# Patient Record
Sex: Female | Born: 1985 | Race: White | Hispanic: No | Marital: Married | Smoking: Former smoker
Health system: Southern US, Community
[De-identification: ages and names within clinical notes are randomized; demographics above are authoritative.]

## PROBLEM LIST (undated history)

## (undated) ENCOUNTER — Inpatient Hospital Stay (HOSPITAL_COMMUNITY): Payer: Self-pay

## (undated) DIAGNOSIS — F329 Major depressive disorder, single episode, unspecified: Secondary | ICD-10-CM

## (undated) DIAGNOSIS — R040 Epistaxis: Secondary | ICD-10-CM

## (undated) DIAGNOSIS — Z8619 Personal history of other infectious and parasitic diseases: Secondary | ICD-10-CM

## (undated) DIAGNOSIS — R011 Cardiac murmur, unspecified: Secondary | ICD-10-CM

## (undated) DIAGNOSIS — R87619 Unspecified abnormal cytological findings in specimens from cervix uteri: Secondary | ICD-10-CM

## (undated) DIAGNOSIS — T7840XA Allergy, unspecified, initial encounter: Secondary | ICD-10-CM

## (undated) DIAGNOSIS — Z6791 Unspecified blood type, Rh negative: Secondary | ICD-10-CM

## (undated) DIAGNOSIS — B999 Unspecified infectious disease: Secondary | ICD-10-CM

## (undated) DIAGNOSIS — R51 Headache: Secondary | ICD-10-CM

## (undated) DIAGNOSIS — IMO0002 Reserved for concepts with insufficient information to code with codable children: Secondary | ICD-10-CM

## (undated) DIAGNOSIS — R519 Headache, unspecified: Secondary | ICD-10-CM

## (undated) HISTORY — DX: Cardiac murmur, unspecified: R01.1

## (undated) HISTORY — PX: COSMETIC SURGERY: SHX468

## (undated) HISTORY — DX: Major depressive disorder, single episode, unspecified: F32.9

## (undated) HISTORY — DX: Epistaxis: R04.0

## (undated) HISTORY — DX: Allergy, unspecified, initial encounter: T78.40XA

## (undated) HISTORY — DX: Unspecified infectious disease: B99.9

## (undated) HISTORY — DX: Personal history of other infectious and parasitic diseases: Z86.19

## (undated) HISTORY — DX: Headache, unspecified: R51.9

## (undated) HISTORY — DX: Unspecified blood type, rh negative: Z67.91

## (undated) HISTORY — DX: Headache: R51

## (undated) HISTORY — DX: Reserved for concepts with insufficient information to code with codable children: IMO0002

## (undated) HISTORY — DX: Unspecified abnormal cytological findings in specimens from cervix uteri: R87.619

---
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
## 2008-05-01 ENCOUNTER — Emergency Department (HOSPITAL_COMMUNITY): Admission: EM | Admit: 2008-05-01 | Discharge: 2008-05-01 | Payer: Self-pay | Admitting: Emergency Medicine

## 2008-06-21 ENCOUNTER — Emergency Department (HOSPITAL_COMMUNITY): Admission: EM | Admit: 2008-06-21 | Discharge: 2008-06-21 | Payer: Self-pay | Admitting: Emergency Medicine

## 2009-09-23 HISTORY — PX: DILATION AND CURETTAGE OF UTERUS: SHX78

## 2011-06-21 LAB — URINALYSIS, ROUTINE W REFLEX MICROSCOPIC
Nitrite: NEGATIVE
Protein, ur: NEGATIVE
Urobilinogen, UA: 0.2

## 2011-06-21 LAB — POCT I-STAT, CHEM 8
BUN: 10
Calcium, Ion: 1.19
Creatinine, Ser: 0.9
Sodium: 141
TCO2: 27

## 2011-06-21 LAB — URINE MICROSCOPIC-ADD ON

## 2011-06-24 LAB — URINALYSIS, ROUTINE W REFLEX MICROSCOPIC
Glucose, UA: NEGATIVE
Hgb urine dipstick: NEGATIVE
Protein, ur: NEGATIVE

## 2011-06-24 LAB — URINE MICROSCOPIC-ADD ON

## 2012-09-23 NOTE — L&D Delivery Note (Signed)
Delivery Note  Pt began pushing at about 2230, vtx descending well, FHR variables noted, overall reassuring    At 11:25 PM a viable female was delivered via Vaginal, Spontaneous Delivery (Presentation: Left Occiput Anterior).  Shoulders delivered slowly, APGAR: 8, 9; weight 9 lb 6 oz (4252 g).  Infant placed on mom's abdomen with lusty cry, cord doubly clamped and cut by FOB  Placenta status: Intact, Spontaneous.  Cord: 3 vessels with the following complications: None.  Cord pH: n/a  Anesthesia: None  Episiotomy: None Lacerations: 2nd degree Suture Repair: 3.0 vicryl rapide Est. Blood Loss (mL): 300  Mom to postpartum.  Baby to nursery-stable. Infant remains stable, initially skin-skin then skin-skin w FOB while completing repair Pt plans to BF, declines circumcision  Routine PP orders   [name] 04/05/2013, 2:14 AM

## 2012-10-07 ENCOUNTER — Ambulatory Visit: Payer: Medicaid Other | Admitting: Obstetrics and Gynecology

## 2012-10-07 DIAGNOSIS — Z331 Pregnant state, incidental: Secondary | ICD-10-CM

## 2012-10-08 ENCOUNTER — Encounter: Payer: Self-pay | Admitting: Obstetrics and Gynecology

## 2012-10-08 DIAGNOSIS — O09A0 Supervision of pregnancy with history of molar pregnancy, unspecified trimester: Secondary | ICD-10-CM | POA: Insufficient documentation

## 2012-10-08 LAB — PRENATAL PANEL VII
Antibody Screen: NEGATIVE
Basophils Relative: 0 % (ref 0–1)
Eosinophils Relative: 1 % (ref 0–5)
HCT: 33.7 % — ABNORMAL LOW (ref 36.0–46.0)
HIV: NONREACTIVE
Hemoglobin: 12 g/dL (ref 12.0–15.0)
Lymphocytes Relative: 17 % (ref 12–46)
MCHC: 35.6 g/dL (ref 30.0–36.0)
MCV: 85.5 fL (ref 78.0–100.0)
Monocytes Absolute: 0.6 10*3/uL (ref 0.1–1.0)
Monocytes Relative: 6 % (ref 3–12)
Neutro Abs: 7.6 10*3/uL (ref 1.7–7.7)
RDW: 13.2 % (ref 11.5–15.5)
Rh Type: NEGATIVE

## 2012-10-08 LAB — POCT URINALYSIS DIPSTICK
Bilirubin, UA: NEGATIVE
Ketones, UA: NEGATIVE
Protein, UA: NEGATIVE
pH, UA: 6

## 2012-10-08 NOTE — Progress Notes (Signed)
NOB interview completed.  PNV samples given. NOB work up scheduled for Thursday 10/15/12 w/ MK.

## 2012-10-15 ENCOUNTER — Encounter: Payer: Self-pay | Admitting: Obstetrics and Gynecology

## 2012-10-15 ENCOUNTER — Ambulatory Visit: Payer: Medicaid Other | Admitting: Obstetrics and Gynecology

## 2012-10-15 VITALS — BP 92/60 | Ht 67.0 in | Wt 117.0 lb

## 2012-10-15 DIAGNOSIS — Z331 Pregnant state, incidental: Secondary | ICD-10-CM

## 2012-10-15 LAB — POCT URINALYSIS DIPSTICK
Bilirubin, UA: NEGATIVE
Glucose, UA: NEGATIVE
Ketones, UA: NEGATIVE
Leukocytes, UA: NEGATIVE

## 2012-10-15 NOTE — Progress Notes (Signed)
[redacted]w[redacted]d  Pt has no concerns today Need to discuss Genetic testing.  07/17/2012 was last pap. "WNL"

## 2012-10-16 NOTE — Progress Notes (Addendum)
Patient ID: Jane Foster, female   DOB: 02-24-1986, 27 y.o.   MRN: [id number] Jane Foster is a 27 y.o. female presenting for new ob visit. [redacted]w[redacted]d N,V-none Birth control at time of conception-no Taking PNV yes LMP certain [redacted]w[redacted]d  OB History    Grav Para Term Preterm Abortions TAB SAB Ect Mult Living   4 2 2  1  1   2     term pg x 2 uncomplicated SVD Past Medical History  Diagnosis Date  . Bleeding nose   . Abnormal Pap smear 2006, 2012, 2013    Colpo done, rpt paps, Last pap 06/2012 was normal  . Infection     UTI;frequently in the past, none in 3-4 years  . Blood type, Rh negative    Past Surgical History  Procedure Date  . Dilation and curettage of uterus 2011    d/t molar pregnancy  . Cosmetic surgery     birth mark removal in the 4th grade  FOB with optic nerve hyperplasia Family History: family history includes Bipolar disorder in her brother; Breast cancer in her maternal grandmother and mother; and Other in her maternal grandmother. Social History:  reports that she quit smoking about 2 months ago. She has never used smokeless tobacco. She reports that she does not drink alcohol or use illicit drugs.  @ROS @    Blood pressure 92/60, height 5\' 7"  (1.702 m), weight 117 lb (53.071 kg), last menstrual period 06/28/2012, unknown if currently breastfeeding. Physical exam: Calm, no distress, HEENT wnl lungs clear bilaterally, breasts bilaterally no masses, dimpling, or drainage, AP RRR, abd soft, gravid, nt, bowel sounds active, abdomen nontender,  Normal hair distrubition mons pubis,  EGBUS WNL, sterile speculum exam,  vagina pink, moist normal rugae,  cerix LTC, no cervical motion tenderness, No adnexal masses or tenderness Uterus size 15 weeks Scant white discharge white Bilaterally DTR + 1 no clonus No edema to lower extremities Prenatal labs: ABO, Rh: A/NEG/-- (01/15 0928) Antibody: NEG (01/15 0928) Rubella:  Immune RPR: NON REAC (01/15 0928)  HBsAg: NEGATIVE  (01/15 0928)  HIV: NON REACTIVE (01/15 0928)  Assessment/Plan: [redacted]w[redacted]d GC/CHL WET PREP PAP 10/13 done at [facility] will have pt sign for results  ULTRASOUND  Plan f/o 4 weeks for anatomy Genetic testing discussed and declines Collaboration with Dr.String[facility] [place] [place] 10/16/2012, 1:28 PM Lavera Guise, CNM Late entry from 10/15/2012

## 2012-11-04 ENCOUNTER — Other Ambulatory Visit: Payer: Self-pay

## 2012-11-04 DIAGNOSIS — Z3689 Encounter for other specified antenatal screening: Secondary | ICD-10-CM

## 2012-11-06 ENCOUNTER — Ambulatory Visit: Payer: Medicaid Other | Admitting: Obstetrics and Gynecology

## 2012-11-06 ENCOUNTER — Encounter: Payer: Self-pay | Admitting: Obstetrics and Gynecology

## 2012-11-06 ENCOUNTER — Ambulatory Visit: Payer: Medicaid Other

## 2012-11-06 VITALS — BP 100/62 | Wt 124.0 lb

## 2012-11-06 DIAGNOSIS — Z331 Pregnant state, incidental: Secondary | ICD-10-CM

## 2012-11-06 DIAGNOSIS — Z1389 Encounter for screening for other disorder: Secondary | ICD-10-CM

## 2012-11-06 DIAGNOSIS — Z3689 Encounter for other specified antenatal screening: Secondary | ICD-10-CM

## 2012-11-06 NOTE — Progress Notes (Signed)
[redacted]w[redacted]d No complaints today. 

## 2012-11-06 NOTE — Progress Notes (Signed)
[redacted]w[redacted]d S=d cx 3.53 cm SIUP vtx Post placenta with 3VC Normal fluid Left sided subdiaphragmatic calcified lesion .62 cm.  Color flow is present.   Will refer to MFM for evaluation.  Pt still needs to think about if she desires to have genetic testing

## 2012-11-06 NOTE — Progress Notes (Signed)
[redacted]w[redacted]d Anatomy U/S today

## 2012-11-09 ENCOUNTER — Telehealth: Payer: Self-pay

## 2012-11-09 DIAGNOSIS — K651 Peritoneal abscess: Secondary | ICD-10-CM

## 2012-11-09 NOTE — Telephone Encounter (Signed)
Message copied by Rolla Plate on Mon Nov 09, 2012 10:09 AM ------      Message from: Jaymes Graff      Created: Fri Nov 06, 2012  3:50 PM       Please make sure pt has an appt with mfm.  thax ------

## 2012-11-09 NOTE — Telephone Encounter (Signed)
Spoke with pt rgd referral informed appt with mfm 11/16/12 at 9:00 pt voice understanding

## 2012-11-10 LAB — US OB COMP + 14 WK

## 2012-11-12 ENCOUNTER — Other Ambulatory Visit: Payer: Self-pay | Admitting: Obstetrics and Gynecology

## 2012-11-12 DIAGNOSIS — Z3689 Encounter for other specified antenatal screening: Secondary | ICD-10-CM

## 2012-11-16 ENCOUNTER — Encounter (HOSPITAL_COMMUNITY): Payer: Self-pay

## 2012-11-16 ENCOUNTER — Ambulatory Visit (HOSPITAL_COMMUNITY)
Admission: RE | Admit: 2012-11-16 | Discharge: 2012-11-16 | Disposition: A | Discharge status: Home / Self Care | Payer: Medicaid Other | Source: Ambulatory Visit | Attending: Obstetrics and Gynecology | Admitting: Obstetrics and Gynecology

## 2012-11-16 DIAGNOSIS — O289 Unspecified abnormal findings on antenatal screening of mother: Secondary | ICD-10-CM | POA: Insufficient documentation

## 2012-11-16 DIAGNOSIS — Z3689 Encounter for other specified antenatal screening: Secondary | ICD-10-CM | POA: Insufficient documentation

## 2012-11-17 ENCOUNTER — Encounter: Payer: Self-pay | Admitting: Obstetrics and Gynecology

## 2012-12-03 ENCOUNTER — Encounter: Payer: Medicaid Other | Admitting: Obstetrics and Gynecology

## 2012-12-03 ENCOUNTER — Ambulatory Visit: Payer: Medicaid Other | Admitting: Obstetrics and Gynecology

## 2012-12-03 ENCOUNTER — Encounter: Payer: Self-pay | Admitting: Obstetrics and Gynecology

## 2012-12-03 VITALS — BP 90/50 | Wt 133.0 lb

## 2012-12-03 DIAGNOSIS — Z331 Pregnant state, incidental: Secondary | ICD-10-CM

## 2012-12-03 NOTE — Patient Instructions (Signed)
[facility]

## 2012-12-03 NOTE — Progress Notes (Signed)
[redacted]w[redacted]d Pt w/o complaint 1 hr glu and Rho Gam NV

## 2013-01-25 LAB — OB RESULTS CONSOLE GC/CHLAMYDIA: Gonorrhea: NEGATIVE

## 2013-02-25 LAB — OB RESULTS CONSOLE GC/CHLAMYDIA: Chlamydia: NEGATIVE

## 2013-03-05 ENCOUNTER — Ambulatory Visit (INDEPENDENT_AMBULATORY_CARE_PROVIDER_SITE_OTHER): Payer: Self-pay | Admitting: Pediatrics

## 2013-03-05 DIAGNOSIS — Z7681 Expectant parent(s) prebirth pediatrician visit: Secondary | ICD-10-CM

## 2013-03-05 NOTE — Progress Notes (Signed)
Subjective:     Patient ID: Jane Foster, female   DOB: 01/27/1986, 27 y.o.   MRN: [id number] HPIReview of SystemsPhysical Exam Prenatal consult for 8 months pregnant mother, fourth child, female due 04/04/2013 Discussed logistics of practice and procedure once baby is born Answered questions

## 2013-03-17 ENCOUNTER — Encounter (HOSPITAL_COMMUNITY): Payer: Self-pay | Admitting: *Deleted

## 2013-03-17 ENCOUNTER — Inpatient Hospital Stay (HOSPITAL_COMMUNITY)
Admission: AD | Admit: 2013-03-17 | Discharge: 2013-03-17 | Disposition: A | Discharge status: Home / Self Care | Payer: Medicaid Other | Source: Ambulatory Visit | Attending: Obstetrics and Gynecology | Admitting: Obstetrics and Gynecology

## 2013-03-17 DIAGNOSIS — A084 Viral intestinal infection, unspecified: Secondary | ICD-10-CM

## 2013-03-17 DIAGNOSIS — IMO0001 Reserved for inherently not codable concepts without codable children: Secondary | ICD-10-CM | POA: Insufficient documentation

## 2013-03-17 DIAGNOSIS — R109 Unspecified abdominal pain: Secondary | ICD-10-CM | POA: Insufficient documentation

## 2013-03-17 DIAGNOSIS — M7918 Myalgia, other site: Secondary | ICD-10-CM

## 2013-03-17 DIAGNOSIS — A088 Other specified intestinal infections: Secondary | ICD-10-CM | POA: Insufficient documentation

## 2013-03-17 DIAGNOSIS — O212 Late vomiting of pregnancy: Secondary | ICD-10-CM | POA: Insufficient documentation

## 2013-03-17 DIAGNOSIS — O99891 Other specified diseases and conditions complicating pregnancy: Secondary | ICD-10-CM | POA: Insufficient documentation

## 2013-03-17 DIAGNOSIS — R0602 Shortness of breath: Secondary | ICD-10-CM | POA: Insufficient documentation

## 2013-03-17 LAB — URINALYSIS, ROUTINE W REFLEX MICROSCOPIC
Bilirubin Urine: NEGATIVE
Glucose, UA: NEGATIVE mg/dL
Hgb urine dipstick: NEGATIVE
Ketones, ur: 15 mg/dL — AB
Nitrite: NEGATIVE
Protein, ur: NEGATIVE mg/dL
Specific Gravity, Urine: 1.025 (ref 1.005–1.030)
Urobilinogen, UA: 0.2 mg/dL (ref 0.0–1.0)
pH: 6 (ref 5.0–8.0)

## 2013-03-17 LAB — URINE MICROSCOPIC-ADD ON

## 2013-03-17 NOTE — MAU Note (Signed)
Pt reports having constant mid to lower back pain and SOB with some nausea. Reports occasional ctx as well.

## 2013-03-17 NOTE — MAU Provider Note (Signed)
Medical screening performed by CNM   S: HPI: Jane Foster is a 27 y.o. [phone] at [redacted]w[redacted]d by LMP who presents to maternity admissions reporting upper back and bilateral upper flank pain x1 day and n/v/d x2-3 days.  Pt had episode of shortness of breath today but reports she feels like this was related to pain.  She reports good fetal movement, denies LOF, vaginal bleeding, vaginal itching/burning, urinary symptoms, h/a, dizziness, or fever/chills.    Pt reports improvement in vomiting since taking new Zofran prescription earlier this morning.  O: BP 113/70  Pulse 85  Temp(Src) 98.3 F (36.8 C) (Oral)  Resp 20  Ht 5\' 7"  (1.702 m)  Wt 67.677 kg (149 lb 3.2 oz)  BMI 23.36 kg/m2  SpO2 100%  LMP 06/28/2012  Results for orders placed during the hospital encounter of 03/17/13 (from the past 24 hour(s))  URINALYSIS, ROUTINE W REFLEX MICROSCOPIC     Status: Abnormal   Collection Time    03/17/13  3:15 PM      Result Value Range   Color, Urine YELLOW  YELLOW   APPearance CLEAR  CLEAR   Specific Gravity, Urine 1.025  1.005 - 1.030   pH 6.0  5.0 - 8.0   Glucose, UA NEGATIVE  NEGATIVE mg/dL   Hgb urine dipstick NEGATIVE  NEGATIVE   Bilirubin Urine NEGATIVE  NEGATIVE   Ketones, ur 15 (*) NEGATIVE mg/dL   Protein, ur NEGATIVE  NEGATIVE mg/dL   Urobilinogen, UA 0.2  0.0 - 1.0 mg/dL   Nitrite NEGATIVE  NEGATIVE   Leukocytes, UA SMALL (*) NEGATIVE  URINE MICROSCOPIC-ADD ON     Status: Abnormal   Collection Time    03/17/13  3:15 PM      Result Value Range   Squamous Epithelial / LPF MANY (*) RARE   WBC, UA 7-10  <3 WBC/hpf   Bacteria, UA FEW (*) RARE   Urine-Other MUCOUS PRESENT     FHR baseline 135 with moderate variability, accels present, no decels--Category I Contractions occasional and mild to palpation  Physical Examination: General appearance - alert, well appearing, and in no distress, oriented to person, place, and time and normal appearing weight Chest - clear to auscultation,  no wheezes, rales or rhonchi, symmetric air entry Heart - normal rate, regular rhythm, normal S1, S2, no murmurs, rubs, clicks or gallops Abdomen - gravid, nontender Back exam - Negative CVA tenderness Musculoskeletal - full range of motion without pain  A: 1. Musculoskeletal pain   2. Viral gastroenteritis     P: D/C home  Pt has prenatal appointment tomorrow am Continue Zofran as prescribed Return to MAU as needed  Sharen Counter Certified Nurse-Midwife

## 2013-03-17 NOTE — MAU Note (Signed)
Pt C/O sudden Pain in upper back which radiates around to rib cage bilaterally.  Pain started approx. 1300 and is also radiating under the breast.  Pt states its hard to catch her breath with the pain.  Pt states she is feeling some lower abd cramping pain too.  No vaginal bleeding or ROM.  Good fetal movement.

## 2013-03-19 LAB — URINE CULTURE: Colony Count: 70000

## 2013-04-04 ENCOUNTER — Encounter (HOSPITAL_COMMUNITY): Payer: Self-pay | Admitting: Obstetrics and Gynecology

## 2013-04-04 ENCOUNTER — Inpatient Hospital Stay (HOSPITAL_COMMUNITY)
Admission: AD | Admit: 2013-04-04 | Discharge: 2013-04-06 | DRG: 775 | Disposition: A | Discharge status: Home / Self Care | Payer: Medicaid Other | Source: Ambulatory Visit | Attending: Obstetrics and Gynecology | Admitting: Obstetrics and Gynecology

## 2013-04-04 DIAGNOSIS — O4290 Premature rupture of membranes, unspecified as to length of time between rupture and onset of labor, unspecified weeks of gestation: Principal | ICD-10-CM | POA: Diagnosis present

## 2013-04-04 DIAGNOSIS — D649 Anemia, unspecified: Secondary | ICD-10-CM | POA: Diagnosis not present

## 2013-04-04 DIAGNOSIS — O9903 Anemia complicating the puerperium: Secondary | ICD-10-CM | POA: Diagnosis not present

## 2013-04-04 LAB — AMNISURE RUPTURE OF MEMBRANE (ROM) NOT AT ARMC: Amnisure ROM: POSITIVE

## 2013-04-04 LAB — CBC
HCT: 34.8 % — ABNORMAL LOW (ref 36.0–46.0)
Hemoglobin: 11.8 g/dL — ABNORMAL LOW (ref 12.0–15.0)
MCHC: 33.9 g/dL (ref 30.0–36.0)
WBC: 12.2 10*3/uL — ABNORMAL HIGH (ref 4.0–10.5)

## 2013-04-04 MED ORDER — PHENYLEPHRINE 40 MCG/ML (10ML) SYRINGE FOR IV PUSH (FOR BLOOD PRESSURE SUPPORT)
80.0000 ug | PREFILLED_SYRINGE | INTRAVENOUS | Status: DC | PRN
  Filled 2013-04-04: qty 2

## 2013-04-04 MED ORDER — FLEET ENEMA 7-19 GM/118ML RE ENEM: 1.0000 | ENEMA | Freq: Every day | RECTAL | Status: DC | PRN

## 2013-04-04 MED ORDER — LACTATED RINGERS IV SOLN: 500.0000 mL | Freq: Once | INTRAVENOUS | Status: DC

## 2013-04-04 MED ORDER — OXYTOCIN BOLUS FROM INFUSION
500.0000 mL | INTRAVENOUS | Status: DC
  Administered 2013-04-04: 500 mL via INTRAVENOUS

## 2013-04-04 MED ORDER — ACETAMINOPHEN 325 MG PO TABS: 650.0000 mg | ORAL_TABLET | ORAL | Status: DC | PRN

## 2013-04-04 MED ORDER — TERBUTALINE SULFATE 1 MG/ML IJ SOLN: 0.2500 mg | Freq: Once | INTRAMUSCULAR | Status: AC | PRN

## 2013-04-04 MED ORDER — LIDOCAINE HCL (PF) 1 % IJ SOLN
30.0000 mL | INTRAMUSCULAR | Status: DC | PRN
  Administered 2013-04-04: 30 mL via SUBCUTANEOUS
  Filled 2013-04-04 (×3): qty 30

## 2013-04-04 MED ORDER — DIPHENHYDRAMINE HCL 50 MG/ML IJ SOLN: 12.5000 mg | INTRAMUSCULAR | Status: DC | PRN

## 2013-04-04 MED ORDER — CITRIC ACID-SODIUM CITRATE 334-500 MG/5ML PO SOLN: 30.0000 mL | ORAL | Status: DC | PRN

## 2013-04-04 MED ORDER — EPHEDRINE 5 MG/ML INJ
10.0000 mg | INTRAVENOUS | Status: DC | PRN
  Filled 2013-04-04: qty 2

## 2013-04-04 MED ORDER — IBUPROFEN 600 MG PO TABS
600.0000 mg | ORAL_TABLET | Freq: Four times a day (QID) | ORAL | Status: DC | PRN
  Administered 2013-04-04: 600 mg via ORAL
  Filled 2013-04-04: qty 1

## 2013-04-04 MED ORDER — LACTATED RINGERS IV SOLN: 500.0000 mL | INTRAVENOUS | Status: DC | PRN

## 2013-04-04 MED ORDER — OXYTOCIN 40 UNITS IN LACTATED RINGERS INFUSION - SIMPLE MED
1.0000 m[IU]/min | INTRAVENOUS | Status: DC
  Administered 2013-04-04: 1 m[IU]/min via INTRAVENOUS
  Administered 2013-04-04: 2 m[IU]/min via INTRAVENOUS
  Filled 2013-04-04: qty 1000

## 2013-04-04 MED ORDER — LACTATED RINGERS IV SOLN: INTRAVENOUS | Status: DC

## 2013-04-04 MED ORDER — OXYTOCIN 40 UNITS IN LACTATED RINGERS INFUSION - SIMPLE MED: 62.5000 mL/h | INTRAVENOUS | Status: DC

## 2013-04-04 MED ORDER — OXYCODONE-ACETAMINOPHEN 5-325 MG PO TABS
1.0000 | ORAL_TABLET | ORAL | Status: DC | PRN
  Administered 2013-04-05: 1 via ORAL
  Filled 2013-04-04: qty 1

## 2013-04-04 MED ORDER — FENTANYL 2.5 MCG/ML BUPIVACAINE 1/10 % EPIDURAL INFUSION (WH - ANES): 14.0000 mL/h | INTRAMUSCULAR | Status: DC | PRN

## 2013-04-04 MED ORDER — ONDANSETRON HCL 4 MG/2ML IJ SOLN: 4.0000 mg | Freq: Four times a day (QID) | INTRAMUSCULAR | Status: DC | PRN

## 2013-04-04 NOTE — MAU Note (Signed)
Pt noticed leaking around 330 thi morning. A big gush at 530 and trickling  A little bit since then. Reports a few mild contractions. Good fetal movement reported.

## 2013-04-04 NOTE — Progress Notes (Addendum)
Patient ID: Antonio Woodhams, female   DOB: 14-Jun-1986, 27 y.o.   MRN: [id number] Cherrill Scrima is a 27 y.o. [id number] at [redacted]w[redacted]d admitted for SROM  Subjective: Ambulating in hall, states ctx aren't  very strong but coming more often, requested pitocin to be started about an hour ago  Objective: BP 107/67  Pulse 71  Temp(Src) 98.6 F (37 C) (Oral)  Resp 18  Ht 5\' 7"  (1.702 m)  Wt 151 lb 12.8 oz (68.856 kg)  BMI 23.77 kg/m2  LMP 06/28/2012     FHT:  FHR: 140 bpm, variability: moderate,  accelerations:  Present,  decelerations:  Absent UC:   irregular, every 4-6 minutes SVE:   Dilation: 4.5 Effacement (%): 90 Station: +1 Exam by:: Dherr rn  Exam deferred   Assessment / Plan: labor augmentation, due to SROM x13hours   Labor: early labor Preeclampsia:  no s/s Fetal Wellbeing:  Category I Pain Control:  Labor support without medications Anticipated MOD:  NSVD  Continue pitocin augmentation for adequate ctx  Recheck 2-3hrs  Update physician PRN   Malissa Hippo 04/04/2013, 6:30 PM

## 2013-04-04 NOTE — Progress Notes (Signed)
Patient ID: Jane Foster, female   DOB: [birth year], 27 y.o.   MRN: [id number] Jane Foster is a 28 y.o. [id number] at [redacted]w[redacted]d admitted for SROM/labor  Subjective: Doing well, breathing w ctx, requesting VE, declines need for pain meds, has been ambulating  Objective: BP 111/76  Pulse 98  Temp(Src) 97.7 F (36.5 C) (Oral)  Resp 20  Ht 5\' 7"  (1.702 m)  Wt 151 lb 12.8 oz (68.856 kg)  BMI 23.77 kg/m2  LMP 06/28/2012     FHT:  FHR: 130 bpm, variability: moderate,  accelerations:  Present,  decelerations:  Absent UC:   regular, every 2-4 minutes SVE:   Dilation: 7 Effacement (%): 90 Station: +1 Exam by:: S Khori Underberg CNM    Assessment / Plan: augmentation, progressing  Labor: active labor Preeclampsia:  no s/s Fetal Wellbeing:  Category I Pain Control:  Labor support without medications Anticipated MOD:  NSVD  Recheck 2hrs or prn urge to push  Dr Estanislado Pandy updated   Malissa Hippo 04/04/2013, 9:17 PM

## 2013-04-04 NOTE — H&P (Signed)
Jane Foster is a 27 y.o. female at 57wks today, presenting for r/o SROM. Pt states she had leaking since 0530, w a few small gushes. States occ ctx. Denies any VB, GFM. Was 3cm in the office. amnisure pos. PT desires low intervention birth.   HPI: Pt began PNC at [facility] at 15wks.  Anatomy scan at 18wks, small calcification was noted, (subdiaphragmatic), Pt had a MFM consult and was told it was benign.  She rcv'd rhogam in the 3rd trimester 1hr gtt WNL At 33wks, PTL was ruled out w a FFN that was neg At 37wks, tx'd for UTI  Last VE 3/80/0    Maternal Medical History:  Reason for admission: Rupture of membranes.   Contractions: Onset was 3-5 hours ago.   Frequency: rare.   Perceived severity is mild.    Fetal activity: Perceived fetal activity is normal.   Last perceived fetal movement was within the past hour.    Prenatal complications: no prenatal complications Prenatal Complications - Diabetes: none.    OB History   Grav Para Term Preterm Abortions TAB SAB Ect Mult Living   4 2 2  1  1   2      G1 10/08 39wks female, 7#5oz, SVD, epidural, no complications G2 9/12 39wks female, 7#12oz, SVD, epidural, VAVD G3 6/11 molar pregnancy, D&C, no comp G4 - current preg     Past Medical History  Diagnosis Date  . Bleeding nose   . Abnormal Pap smear 2006, 2012, 2013    Colpo done, rpt paps, Last pap 06/2012 was normal  . Infection     UTI;frequently in the past, none in 3-4 years  . Blood type, Rh negative    Past Surgical History  Procedure Laterality Date  . Dilation and curettage of uterus  2011    d/t molar pregnancy  . Cosmetic surgery      birth mark removal in the 4th grade   Family History: family history includes Bipolar disorder in her brother; Breast cancer in her maternal grandmother and mother; and Other in her maternal grandmother. Social History:  reports that she quit smoking about 8 months ago. She has never used smokeless tobacco. She reports that she  does not drink alcohol or use illicit drugs.   Prenatal Transfer Tool  Maternal Diabetes: No Genetic Screening: Declined Maternal Ultrasounds/Referrals: Abnormal:  Findings:   Other:subdiaphragmatic calcification noted at 18wk anatomy scan, MFM consult stated it was benign.  Fetal Ultrasounds or other Referrals:  Referred to Materal Fetal Medicine  Maternal Substance Abuse:  No Significant Maternal Medications:  None Significant Maternal Lab Results:  Lab values include: Group B Strep negative Other Comments:  None  Review of Systems  All other systems reviewed and are negative.    Dilation: 4 Effacement (%): 90 Exam by:: S. Terril Amaro CNM Blood pressure 106/62, pulse 83, temperature 98 F (36.7 C), temperature source Oral, resp. rate 18, height 5\' 7"  (1.702 m), weight 151 lb 12.8 oz (68.856 kg), last menstrual period 06/28/2012. Maternal Exam:  Uterine Assessment: Contraction strength is mild.  Contraction frequency is rare.   Abdomen: Patient reports no abdominal tenderness. Fundal height is aga.   Estimated fetal weight is 7-8#.   Fetal presentation: vertex  Introitus: Normal vulva. Normal vagina.  Ferning test: negative.  Amniotic fluid character: clear. amnisure pos   Pelvis: adequate for delivery.   Cervix: Cervix evaluated by digital exam.     Fetal Exam Fetal Monitor Review: Mode: ultrasound.   Baseline rate:  130.  Variability: moderate (6-25 bpm).   Pattern: accelerations present and no decelerations.    Fetal State Assessment: Category I - tracings are normal.     Physical Exam  Nursing note and vitals reviewed. Constitutional: She is oriented to person, place, and time. She appears well-developed and well-nourished.  HENT:  Head: Normocephalic.  Eyes: Pupils are equal, round, and reactive to light.  Neck: Normal range of motion.  Cardiovascular: Normal rate, regular rhythm and normal heart sounds.   Respiratory: Effort normal and breath sounds normal.   GI: Soft. Bowel sounds are normal.  Genitourinary: Vagina normal.  Cervix stretchy 4/100/0  Fern neg, sm amt clear fluid noted w vag exam amnisure pos   Musculoskeletal: Normal range of motion.  Neurological: She is alert and oriented to person, place, and time. She has normal reflexes.  Skin: Skin is warm and dry.  Psychiatric: She has a normal mood and affect. Her behavior is normal.    Prenatal labs: ABO, Rh: A/NEG/-- (01/15 0928) Antibody: NEG (01/15 0928) Rubella: 1.81 (01/15 0928) RPR: NON REAC (01/15 0928)  HBsAg: NEGATIVE (01/15 0928)  HIV: NON REACTIVE (01/15 0928)  GBS:   neg 6/17  1hr gtt 84, hgb 11.5, RPR NR 4/11   Assessment/Plan: IUP at 40wks SROM, early labor GBS neg FHR reassuring  Admit to b.s per c/w Dr Su Hilt Routine L&D orders Pt desires non-interventive birth Expectant mgmnt at present     Jandiel Magallanes M 04/04/2013, 4:12 PM

## 2013-04-05 ENCOUNTER — Encounter (HOSPITAL_COMMUNITY): Payer: Self-pay | Admitting: Obstetrics

## 2013-04-05 LAB — CBC
Hemoglobin: 9.8 g/dL — ABNORMAL LOW (ref 12.0–15.0)
MCHC: 34.1 g/dL (ref 30.0–36.0)
Platelets: 174 10*3/uL (ref 150–400)
RDW: 12.8 % (ref 11.5–15.5)

## 2013-04-05 MED ORDER — RHO D IMMUNE GLOBULIN 1500 UNIT/2ML IJ SOLN
300.0000 ug | Freq: Once | INTRAMUSCULAR | Status: AC
  Administered 2013-04-05: 300 ug via INTRAMUSCULAR
  Filled 2013-04-05: qty 2

## 2013-04-05 MED ORDER — PRENATAL MULTIVITAMIN CH
1.0000 | ORAL_TABLET | Freq: Every day | ORAL | Status: DC
  Administered 2013-04-05: 1 via ORAL
  Filled 2013-04-05: qty 1

## 2013-04-05 MED ORDER — BENZOCAINE-MENTHOL 20-0.5 % EX AERO
1.0000 "application " | INHALATION_SPRAY | CUTANEOUS | Status: DC | PRN
  Filled 2013-04-05: qty 56

## 2013-04-05 MED ORDER — BISACODYL 10 MG RE SUPP: 10.0000 mg | Freq: Every day | RECTAL | Status: DC | PRN

## 2013-04-05 MED ORDER — MEDROXYPROGESTERONE ACETATE 150 MG/ML IM SUSP: 150.0000 mg | INTRAMUSCULAR | Status: DC | PRN

## 2013-04-05 MED ORDER — ZOLPIDEM TARTRATE 5 MG PO TABS: 5.0000 mg | ORAL_TABLET | Freq: Every evening | ORAL | Status: DC | PRN

## 2013-04-05 MED ORDER — SIMETHICONE 80 MG PO CHEW: 80.0000 mg | CHEWABLE_TABLET | ORAL | Status: DC | PRN

## 2013-04-05 MED ORDER — ONDANSETRON HCL 4 MG/2ML IJ SOLN: 4.0000 mg | INTRAMUSCULAR | Status: DC | PRN

## 2013-04-05 MED ORDER — OXYCODONE-ACETAMINOPHEN 5-325 MG PO TABS
1.0000 | ORAL_TABLET | ORAL | Status: DC | PRN
  Administered 2013-04-05 – 2013-04-06 (×6): 1 via ORAL
  Filled 2013-04-05: qty 1
  Filled 2013-04-05: qty 2
  Filled 2013-04-05 (×4): qty 1

## 2013-04-05 MED ORDER — LANOLIN HYDROUS EX OINT: TOPICAL_OINTMENT | CUTANEOUS | Status: DC | PRN

## 2013-04-05 MED ORDER — POLYSACCHARIDE IRON COMPLEX 150 MG PO CAPS
150.0000 mg | ORAL_CAPSULE | Freq: Two times a day (BID) | ORAL | Status: DC
  Administered 2013-04-05 – 2013-04-06 (×2): 150 mg via ORAL
  Filled 2013-04-05 (×2): qty 1

## 2013-04-05 MED ORDER — FLEET ENEMA 7-19 GM/118ML RE ENEM: 1.0000 | ENEMA | Freq: Every day | RECTAL | Status: DC | PRN

## 2013-04-05 MED ORDER — DIBUCAINE 1 % RE OINT: 1.0000 "application " | TOPICAL_OINTMENT | RECTAL | Status: DC | PRN

## 2013-04-05 MED ORDER — TETANUS-DIPHTH-ACELL PERTUSSIS 5-2.5-18.5 LF-MCG/0.5 IM SUSP
0.5000 mL | Freq: Once | INTRAMUSCULAR | Status: AC
  Administered 2013-04-06: 0.5 mL via INTRAMUSCULAR

## 2013-04-05 MED ORDER — ONDANSETRON HCL 4 MG PO TABS: 4.0000 mg | ORAL_TABLET | ORAL | Status: DC | PRN

## 2013-04-05 MED ORDER — MEASLES, MUMPS & RUBELLA VAC ~~LOC~~ INJ
0.5000 mL | INJECTION | Freq: Once | SUBCUTANEOUS | Status: DC
  Filled 2013-04-05: qty 0.5

## 2013-04-05 MED ORDER — IBUPROFEN 600 MG PO TABS
600.0000 mg | ORAL_TABLET | Freq: Four times a day (QID) | ORAL | Status: DC
  Administered 2013-04-05 – 2013-04-06 (×5): 600 mg via ORAL
  Filled 2013-04-05 (×5): qty 1

## 2013-04-05 MED ORDER — WITCH HAZEL-GLYCERIN EX PADS: 1.0000 "application " | MEDICATED_PAD | CUTANEOUS | Status: DC | PRN

## 2013-04-05 MED ORDER — SENNOSIDES-DOCUSATE SODIUM 8.6-50 MG PO TABS
2.0000 | ORAL_TABLET | Freq: Every day | ORAL | Status: DC
  Administered 2013-04-05 (×2): 2 via ORAL

## 2013-04-05 MED ORDER — DIPHENHYDRAMINE HCL 25 MG PO CAPS: 25.0000 mg | ORAL_CAPSULE | Freq: Four times a day (QID) | ORAL | Status: DC | PRN

## 2013-04-05 NOTE — Progress Notes (Signed)
Post Partum Day 1: S/P SVD with 2nd degree lac  Subjective: Patient up ad lib, denies syncope or dizziness. Feeding:  Breastfeeding Contraceptive plan:   Micronor  Objective: Blood pressure 102/68, pulse 91, temperature 98.5 F (36.9 C), temperature source Oral, resp. rate 20, height 5\' 7"  (1.702 m), weight 151 lb 12.8 oz (68.856 kg), last menstrual period 06/28/2012, SpO2 97.00%, unknown if currently breastfeeding.  Physical Exam:  General: alert, cooperative, fatigued and no distress Lochia: appropriate Uterine Fundus: firm Incision: healing well DVT Evaluation: No evidence of DVT seen on physical exam. Negative Homan's sign.   Recent Labs  04/04/13 1120 04/05/13 0625  HGB 11.8* 9.8*  HCT 34.8* 28.7*    Assessment/Plan: S/P Vaginal delivery day 1 Continue current care Plan for discharge tomorrow   LOS: 1 day   Alazay Leicht 04/05/2013, 11:18 AM

## 2013-04-05 NOTE — Progress Notes (Addendum)
Patient ID: Jane Foster, female   DOB: 08-30-1986, 27 y.o.   MRN: [id number] Jane Foster is a 27 y.o. [id number] at [redacted]w[redacted]d admitted for SROM/early labor  Subjective: C/o urge to push,   Objective: BP 107/65  Pulse 85  Temp(Src) 98.1 F (36.7 C) (Oral)  Resp 20  Ht 5\' 7"  (1.702 m)  Wt 151 lb 12.8 oz (68.856 kg)  BMI 23.77 kg/m2  SpO2 96%  LMP 06/28/2012  Total I/O In: -  Out: 300 [Blood:300]  FHT:  FHR: 130 bpm, variability: moderate,  accelerations:  Present,  decelerations:  Present, variables Variables noted after AROM of forebag, pitocin dc'd, position change, UC:   regular, every 2-3 minutes SVE:   10/100/+1/+2  forebag AROM'd, sm amt clear fluid    Assessment / Plan: augmentation of labor, progressing Pt began involuntarily pushing, and was pushing well, vtx descending, FHR overall reassuring, early variables persisted   Labor: 2nd stage Preeclampsia:  no s/s Fetal Wellbeing:  Category I Pain Control:  Labor support without medications Anticipated MOD:  NSVD  Continue pushing, CTO FHR closely  Update physician PRN   Stefano Trulson M 04/05/2013, 2:10 AM

## 2013-04-06 LAB — RH IG WORKUP (INCLUDES ABO/RH)
ABO/RH(D): A NEG
Fetal Screen: NEGATIVE
Gestational Age(Wks): 40

## 2013-04-06 MED ORDER — OXYCODONE-ACETAMINOPHEN 5-325 MG PO TABS: 1.0000 | ORAL_TABLET | ORAL | Status: DC | PRN

## 2013-04-06 MED ORDER — IBUPROFEN 600 MG PO TABS: 600.0000 mg | ORAL_TABLET | Freq: Four times a day (QID) | ORAL | Status: DC

## 2013-04-06 MED ORDER — NORETHINDRONE 0.35 MG PO TABS: 1.0000 | ORAL_TABLET | Freq: Every day | ORAL | Status: DC

## 2013-04-06 NOTE — Discharge Summary (Signed)
  Vaginal Delivery Discharge Summary  Lawsyn Heiler  DOB:    [birth year] MRN:    [id number] CSN:    [id number]  Date of admission:                  04/04/13  Date of discharge:                   04/06/13  Procedures this admission: SVD with a 2nd degree lac  Date of Delivery: 04/04/13 by Sanda Klein  Newborn Data:  Live born female  Birth Weight: 9 lb 6 oz (4252 g) APGAR: 8, 9  Home with mother. Name: "Samuel Bouche" Circumcision Plan: no circ  History of Present Illness:  Ms. Lakeisha Waldrop is a 27 y.o. female, [phone], who presents at [redacted]w[redacted]d weeks gestation. The patient has been followed at the [facility] of [facility]. She was admitted rupture of membranes. Her pregnancy has been complicated by:   Patient Active Problem List   Diagnosis Date Noted  . NSVD (normal spontaneous vaginal delivery) 04/05/2013  . Second-degree perineal laceration, with delivery 04/05/2013  . Fetal abnormality in pregnancy, antepartum 11/17/2012  . Rh negative state in antepartum period 10/08/2012  . H/O molar pregnancy, antepartum 10/08/2012   Hospital course:  The patient was admitted for PROM.   Her labor was not complicated. She proceeded to have a vaginal delivery of a healthy infant. Her delivery was not complicated. Her postpartum course was not complicated.  She was discharged to home on postpartum day 2 doing well.  Feeding:  breast  Contraception:  oral progesterone-only contraceptive  Discharge hemoglobin:  Hemoglobin  Date Value Range Status  04/05/2013 9.8* 12.0 - 15.0 g/dL Final     DELTA CHECK NOTED     REPEATED TO VERIFY     HCT  Date Value Range Status  04/05/2013 28.7* 36.0 - 46.0 % Final    Discharge Physical Exam:   General: alert, cooperative and no distress Lochia: appropriate Uterine Fundus: firm Incision: healing well DVT Evaluation: No evidence of DVT seen on physical exam. Negative Homan's  sign.  Intrapartum Procedures: spontaneous vaginal delivery Postpartum Procedures: none Complications-Operative and Postpartum: 2nd degree perineal laceration  Discharge Diagnoses: Term Pregnancy-delivered and PROM x21 hours, mild anemia  Discharge Information:  Activity:           pelvic rest Diet:                routine Medications: PNV, Ibuprofen, Percocet and Micronor Condition:      stable Instructions:  refer to practice specific booklet Discharge to: home  Follow-up Information   Follow up with [facility]. Schedule an appointment as soon as possible for a visit in 6 weeks. (Call with any questions or concerns)    Contact information:   [address] [place] [address] [phone]       Haroldine Laws 04/06/2013

## 2013-04-07 ENCOUNTER — Encounter (HOSPITAL_COMMUNITY): Payer: Self-pay | Admitting: *Deleted

## 2013-04-07 ENCOUNTER — Inpatient Hospital Stay (HOSPITAL_COMMUNITY)
Admission: AD | Admit: 2013-04-07 | Discharge: 2013-04-07 | Disposition: A | Discharge status: Home / Self Care | Payer: Medicaid Other | Source: Ambulatory Visit | Attending: Obstetrics and Gynecology | Admitting: Obstetrics and Gynecology

## 2013-04-07 DIAGNOSIS — IMO0002 Reserved for concepts with insufficient information to code with codable children: Secondary | ICD-10-CM | POA: Insufficient documentation

## 2013-04-07 DIAGNOSIS — R51 Headache: Secondary | ICD-10-CM | POA: Insufficient documentation

## 2013-04-07 DIAGNOSIS — R42 Dizziness and giddiness: Secondary | ICD-10-CM | POA: Insufficient documentation

## 2013-04-07 LAB — CBC
Platelets: 203 10*3/uL (ref 150–400)
RBC: 3.52 MIL/uL — ABNORMAL LOW (ref 3.87–5.11)
WBC: 14.7 10*3/uL — ABNORMAL HIGH (ref 4.0–10.5)

## 2013-04-07 NOTE — MAU Note (Signed)
Patient states she had a vaginal delivery on 7-13. Started having increasing swelling in legs and feet last night. Has a headache a dizziness today.

## 2013-04-07 NOTE — MAU Provider Note (Signed)
History   27yo [id number] - PP day 2, presents after calling the office with c/o HA, dizziness, and swelling in hands and feet.  Pt's may concerns is the swelling and states that the dizziness is her norm and she feels her HA is d/t fatigue.  Pt also states that she doesn't feel that her symptoms really warrented an "ER" visit but was told by the office to come to [facility].  Hgb after delivery was 9.8.  Otherwise pt reports lochia is minimal, breastfeeding is going well, and "feeling fine."  Denies recent fever, resp or GI c/o's, UTI or other PIH s/s.  Chief Complaint  Patient presents with  . Leg Swelling  . Headache  . Dizziness    OB History   Grav Para Term Preterm Abortions TAB SAB Ect Mult Living   4 3 3  1  1   3       Past Medical History  Diagnosis Date  . Bleeding nose   . Abnormal Pap smear 2006, 2012, 2013    Colpo done, rpt paps, Last pap 06/2012 was normal  . Infection     UTI;frequently in the past, none in 3-4 years  . Blood type, Rh negative     Past Surgical History  Procedure Laterality Date  . Dilation and curettage of uterus  2011    d/t molar pregnancy  . Cosmetic surgery      birth mark removal in the 4th grade    Family History  Problem Relation Age of Onset  . Other Maternal Grandmother     Varicose veins  . Breast cancer Mother     Menopausal  . Breast cancer Maternal Grandmother     Menopausal  . Bipolar disorder Brother     History  Substance Use Topics  . Smoking status: Former Smoker    Quit date: 07/24/2012  . Smokeless tobacco: Never Used  . Alcohol Use: No     Comment: Prior to pregnancy 1 beer/night    Allergies: No Known Allergies  Prescriptions prior to admission  Medication Sig Dispense Refill  . calcium carbonate (TUMS - DOSED IN MG ELEMENTAL CALCIUM) 500 MG chewable tablet Chew 2 tablets by mouth daily as needed for heartburn.      Marland Kitchen ibuprofen (ADVIL,MOTRIN) 600 MG tablet Take 1 tablet (600 mg total) by mouth every 6 (six)  hours.  30 tablet  0  . norethindrone (ORTHO MICRONOR) 0.35 MG tablet Take 1 tablet (0.35 mg total) by mouth daily.  1 Package  11  . oxyCODONE-acetaminophen (PERCOCET/ROXICET) 5-325 MG per tablet Take 1-2 tablets by mouth every 4 (four) hours as needed.  30 tablet  0  . Prenatal Vit-Fe Fumarate-FA (PRENATAL MULTIVITAMIN) TABS Take 1 tablet by mouth daily at 12 noon.        ROS: see HPI above, all other systems are negative   Physical Exam   Blood pressure 112/61, pulse 81, temperature 98.5 F (36.9 C), temperature source Oral, resp. rate 18, height 5\' 6"  (1.676 m), weight 143 lb 3.2 oz (64.955 kg), SpO2 100.00%, unknown if currently breastfeeding.  Chest: Clear Heart: RRR Abdomen: gravid, NT Extremities: WNL  Filed Vitals:   04/07/13 1235 04/07/13 1328  BP: 119/61 112/61  Pulse: 80 81  Temp: 98.5 F (36.9 C)   TempSrc: Oral   Resp: 16 18  Height: 5\' 6"  (1.676 m)   Weight: 143 lb 3.2 oz (64.955 kg)   SpO2: 100%     ED Course  PP  day 2 Swelling HA and dizziness  CBC  D/c home with precautions Call office if symptoms get worse F/u with already scheduled PP appt on 8/22   Haroldine Laws CNM, MSN 04/07/2013 1:32 PM

## 2013-04-08 NOTE — Progress Notes (Deleted)
Patient ID: Jane Foster, female   DOB: 1985-12-10, 27 y.o.   MRN: [id number] Ren Grasse is a 27 y.o. [phone] at [redacted]w[redacted]d admitted for SROM  Subjective: Has been ambulating/using ball, some ctx strong, but overall not painful, now requesting pitocin augmentation   Objective: BP 99/63  Pulse 61  Temp(Src) 97.6 F (36.4 C) (Oral)  Resp 18  Ht 5\' 7"  (1.702 m)  Wt 151 lb 12.8 oz (68.856 kg)  BMI 23.77 kg/m2  SpO2 99%  LMP 06/28/2012     FHT:  FHR: 140 bpm, variability: moderate,  accelerations:  Present,  decelerations:  Absent UC:   irregular, every 5-8 minutes SVE:  VE per RN 4.5/90/0   Assessment / Plan: SROM without active labor  Labor: will begin pitocin augmentation Preeclampsia:  no s/s Fetal Wellbeing:  Category I Pain Control:  Labor support without medications Anticipated MOD:  NSVD  Recheck 2-3hrs   Update physician PRN   Irlanda Croghan M 04/08/2013, 11:10 PM (late entry)

## 2013-04-08 NOTE — Progress Notes (Deleted)
Patient ID: Jane Foster, female   DOB: 1985/12/28, 27 y.o.   MRN: [id number] Jane Foster is a 27 y.o. [phone] at [redacted]w[redacted]d admitted for SROM  Subjective: Breathing w ctx now, sitting on toilet, continues leaking fluid, requests VE, declines pain meds, coping well    Objective: BP 99/63  Pulse 61  Temp(Src) 97.6 F (36.4 C) (Oral)  Resp 18  Ht 5\' 7"  (1.702 m)  Wt 151 lb 12.8 oz (68.856 kg)  BMI 23.77 kg/m2  SpO2 99%  LMP 06/28/2012     FHT:  FHR: 140 bpm, variability: moderate,  accelerations:  Present,  decelerations:  Absent FHR not tracing well at times due to maternal position  UC:   regular, every 2-4  minutes SVE:   7/100/0+1   Assessment / Plan: Augmentation of labor, progressing well  Labor: Progressing normally Preeclampsia:  no s/s Fetal Wellbeing:  Category I Pain Control:  Labor support without medications Anticipated MOD:  NSVD  Recheck 2-3hrs   Update physician PRN   Malissa Hippo 04/08/2013, 11:14 PM (late entry)

## 2013-07-29 ENCOUNTER — Other Ambulatory Visit: Payer: Self-pay

## 2014-07-25 ENCOUNTER — Encounter (HOSPITAL_COMMUNITY): Payer: Self-pay | Admitting: *Deleted

## 2014-10-27 ENCOUNTER — Emergency Department (HOSPITAL_COMMUNITY)
Admission: EM | Admit: 2014-10-27 | Discharge: 2014-10-27 | Disposition: A | Discharge status: Home / Self Care | Payer: Medicaid Other | Attending: Emergency Medicine | Admitting: Emergency Medicine

## 2014-10-27 ENCOUNTER — Emergency Department (HOSPITAL_COMMUNITY)
Admission: EM | Admit: 2014-10-27 | Discharge: 2014-10-27 | Disposition: A | Discharge status: Home / Self Care | Payer: Medicaid Other | Source: Home / Self Care | Attending: Emergency Medicine | Admitting: Emergency Medicine

## 2014-10-27 ENCOUNTER — Encounter (HOSPITAL_COMMUNITY): Payer: Self-pay | Admitting: Emergency Medicine

## 2014-10-27 ENCOUNTER — Emergency Department (HOSPITAL_COMMUNITY): Payer: Medicaid Other

## 2014-10-27 DIAGNOSIS — Z87891 Personal history of nicotine dependence: Secondary | ICD-10-CM | POA: Diagnosis not present

## 2014-10-27 DIAGNOSIS — R110 Nausea: Secondary | ICD-10-CM | POA: Insufficient documentation

## 2014-10-27 DIAGNOSIS — R0981 Nasal congestion: Secondary | ICD-10-CM | POA: Insufficient documentation

## 2014-10-27 DIAGNOSIS — R05 Cough: Secondary | ICD-10-CM | POA: Diagnosis present

## 2014-10-27 DIAGNOSIS — R0789 Other chest pain: Secondary | ICD-10-CM

## 2014-10-27 DIAGNOSIS — Z79899 Other long term (current) drug therapy: Secondary | ICD-10-CM | POA: Insufficient documentation

## 2014-10-27 DIAGNOSIS — Z8619 Personal history of other infectious and parasitic diseases: Secondary | ICD-10-CM | POA: Insufficient documentation

## 2014-10-27 DIAGNOSIS — R42 Dizziness and giddiness: Secondary | ICD-10-CM | POA: Insufficient documentation

## 2014-10-27 DIAGNOSIS — M549 Dorsalgia, unspecified: Secondary | ICD-10-CM | POA: Diagnosis not present

## 2014-10-27 DIAGNOSIS — M940 Chondrocostal junction syndrome [Tietze]: Secondary | ICD-10-CM | POA: Diagnosis not present

## 2014-10-27 DIAGNOSIS — Z3202 Encounter for pregnancy test, result negative: Secondary | ICD-10-CM | POA: Diagnosis not present

## 2014-10-27 LAB — CBC WITH DIFFERENTIAL/PLATELET
BASOS ABS: 0 10*3/uL (ref 0.0–0.1)
BASOS PCT: 0 % (ref 0–1)
EOS ABS: 0.2 10*3/uL (ref 0.0–0.7)
EOS PCT: 2 % (ref 0–5)
HEMATOCRIT: 38.3 % (ref 36.0–46.0)
Hemoglobin: 13.4 g/dL (ref 12.0–15.0)
LYMPHS ABS: 3 10*3/uL (ref 0.7–4.0)
Lymphocytes Relative: 31 % (ref 12–46)
MCH: 30 pg (ref 26.0–34.0)
MCHC: 35 g/dL (ref 30.0–36.0)
MCV: 85.9 fL (ref 78.0–100.0)
MONO ABS: 0.5 10*3/uL (ref 0.1–1.0)
Monocytes Relative: 5 % (ref 3–12)
NEUTROS PCT: 62 % (ref 43–77)
Neutro Abs: 5.9 10*3/uL (ref 1.7–7.7)
Platelets: 259 10*3/uL (ref 150–400)
RBC: 4.46 MIL/uL (ref 3.87–5.11)
RDW: 12 % (ref 11.5–15.5)
WBC: 9.6 10*3/uL (ref 4.0–10.5)

## 2014-10-27 LAB — POC URINE PREG, ED: Preg Test, Ur: NEGATIVE

## 2014-10-27 LAB — BASIC METABOLIC PANEL
ANION GAP: 9 (ref 5–15)
BUN: 10 mg/dL (ref 6–23)
CO2: 25 mmol/L (ref 19–32)
Calcium: 9.3 mg/dL (ref 8.4–10.5)
Chloride: 105 mmol/L (ref 96–112)
Creatinine, Ser: 0.8 mg/dL (ref 0.50–1.10)
GFR calc Af Amer: >90 mL/min (ref >90)
Glucose, Bld: 80 mg/dL (ref 70–99)
Potassium: 4 mmol/L (ref 3.5–5.1)
Sodium: 139 mmol/L (ref 135–145)

## 2014-10-27 LAB — I-STAT TROPONIN, ED: Troponin i, poc: 0 ng/mL (ref 0.00–0.08)

## 2014-10-27 LAB — D-DIMER, QUANTITATIVE: D-Dimer, Quant: <0.27 ug/mL-FEU (ref 0.00–0.48)

## 2014-10-27 LAB — POCT PREGNANCY, URINE: Preg Test, Ur: NEGATIVE

## 2014-10-27 NOTE — Discharge Instructions (Signed)
The pain you are having is most likely musculoskeletal, pain from the ribs and muscles called costochondritis. There is a smaller likelihood that it is pleurisy, or inflammation of the linings of the lung and chest wall. Either way, the treatment is conservative management with over the counter NSAIDs. You can take ibuprofen or aleve, ibuprofen up to 600-800mg  per dose. You can also try tylenol as well, 500-650mg  per dose. If you take the higher doses, try to take it with meals because it can irritate the lining of your stomach (and taken long term cause ulcers). Follow up with your primary doctor in 1-2 weeks, or sooner if you think the pain is getting worse.  Costochondritis Costochondritis, sometimes called Tietze syndrome, is a swelling and irritation (inflammation) of the tissue (cartilage) that connects your ribs with your breastbone (sternum). It causes pain in the chest and rib area. Costochondritis usually goes away on its own over time. It can take up to 6 weeks or longer to get better, especially if you are unable to limit your activities. CAUSES  Some cases of costochondritis have no known cause. Possible causes include:  Injury (trauma).  Exercise or activity such as lifting.  Severe coughing. SIGNS AND SYMPTOMS  Pain and tenderness in the chest and rib area.  Pain that gets worse when coughing or taking deep breaths.  Pain that gets worse with specific movements. DIAGNOSIS  Your health care provider will do a physical exam and ask about your symptoms. Chest X-rays or other tests may be done to rule out other problems. TREATMENT  Costochondritis usually goes away on its own over time. Your health care provider may prescribe medicine to help relieve pain. HOME CARE INSTRUCTIONS   Avoid exhausting physical activity. Try not to strain your ribs during normal activity. This would include any activities using chest, abdominal, and side muscles, especially if heavy weights are  used.  Apply ice to the affected area for the first 2 days after the pain begins.  Put ice in a plastic bag.  Place a towel between your skin and the bag.  Leave the ice on for 20 minutes, 2-3 times a day.  Only take over-the-counter or prescription medicines as directed by your health care provider. SEEK MEDICAL CARE IF:  You have redness or swelling at the rib joints. These are signs of infection.  Your pain does not go away despite rest or medicine. SEEK IMMEDIATE MEDICAL CARE IF:   Your pain increases or you are very uncomfortable.  You have shortness of breath or difficulty breathing.  You cough up blood.  You have worse chest pains, sweating, or vomiting.  You have a fever or persistent symptoms for more than 2-3 days.  You have a fever and your symptoms suddenly get worse. MAKE SURE YOU:   Understand these instructions.  Will watch your condition.  Will get help right away if you are not doing well or get worse. Document Released: 06/19/2005 Document Revised: 06/30/2013 Document Reviewed: 04/13/2013 [facility] Patient Information 2015 [employer] [place]. This information is not intended to replace advice given to you by your health care provider. Make sure you discuss any questions you have with your health care provider.

## 2014-10-27 NOTE — ED Provider Notes (Signed)
CSN: [id number]     Arrival date & time 10/27/14  1111 History   First MD Initiated Contact with Patient 10/27/14 1342     Chief Complaint  Patient presents with  . Chest Pain  . Cough     (Consider location/radiation/quality/duration/timing/severity/associated sxs/prior Treatment) HPI Comments: Pt complains of 3 day history of substernal chest pain. No significant PMH. Pt unable to say when exactly the pain started, but does deny any trauma, lifting heavy weights, exercise. She did develop cold symptoms and a cough about 4 days ago, but says the pain is unrelated to the cough. States pain is below the sternum and xiphoid, but also radiates underneath both ribs in the front and into her mid-back. Pain is intermittent, will go away at times and get worse for 30-60 minutes at a time. Pain is dull, but has periods of worsened sharp/stabbing pains that last up to 5 minutes when the dull pain comes on. She does not relate this to anything except maybe a little worse with exertion, slightly improved with rest. Maybe hurts a little more with deep breathing when the pain is present, but can't say for sure. She has tried taking over the counter pain medications like ibuprofen which only help minimally. She has some associated shortness of breath. Denies heartburn, leg swelling, history of DVT or clots. She says she does feel slightly weaker in both of her arms later in the day, but this goes away. She did start taking hormonal birth control pills in November, has been switched 3 different times because of side effect issues. History of DVT in grandmother in setting of malignancy.   Patient is a 29 y.o. female presenting with chest pain and cough. The history is provided by the patient. No language interpreter was used.  Chest Pain Pain location:  Substernal area and epigastric Pain quality: dull, radiating, sharp and stabbing   Pain radiates to:  Mid back and epigastrium Pain radiates to the back: yes    Pain severity:  Moderate Onset quality:  Sudden Duration:  3 days Timing:  Intermittent Progression:  Unchanged Chronicity:  New Context: breathing and movement   Relieved by:  Rest (minimally improved with rest) Worsened by:  Exertion Ineffective treatments: NSAIDs. Associated symptoms: back pain, cough, dizziness, nausea and shortness of breath   Associated symptoms: no abdominal pain, no anxiety, no dysphagia, no fever, no heartburn, no lower extremity edema, no numbness, no palpitations, no syncope and not vomiting   Cough:    Cough characteristics:  Non-productive   Severity:  Mild   Duration:  4 days   Timing:  Constant   Progression:  Unchanged   Chronicity:  New Risk factors: birth control   Risk factors: no immobilization, no prior DVT/PE and no surgery   Cough Associated symptoms: chest pain and shortness of breath   Associated symptoms: no chills, no fever, no rash and no wheezing     Past Medical History  Diagnosis Date  . Bleeding nose   . Abnormal Pap smear 2006, 2012, 2013    Colpo done, rpt paps, Last pap 06/2012 was normal  . Infection     UTI;frequently in the past, none in 3-4 years  . Blood type, Rh negative    Past Surgical History  Procedure Laterality Date  . Dilation and curettage of uterus  2011    d/t molar pregnancy  . Cosmetic surgery      birth mark removal in the 4th grade   Family History  Problem Relation Age of Onset  . Other Maternal Grandmother     Varicose veins  . Breast cancer Mother     Menopausal  . Breast cancer Maternal Grandmother     Menopausal  . Bipolar disorder Brother    History  Substance Use Topics  . Smoking status: Former Smoker    Quit date: 07/24/2012  . Smokeless tobacco: Never Used  . Alcohol Use: No     Comment: Prior to pregnancy 1 beer/night   OB History    Gravida Para Term Preterm AB TAB SAB Ectopic Multiple Living   Review of Systems  Constitutional: Negative for fever  and chills.  HENT: Positive for congestion. Negative for trouble swallowing.   Eyes: Negative for visual disturbance.  Respiratory: Positive for cough and shortness of breath. Negative for wheezing.   Cardiovascular: Positive for chest pain. Negative for palpitations, leg swelling and syncope.  Gastrointestinal: Positive for nausea. Negative for heartburn, vomiting, abdominal pain, diarrhea and constipation.  Genitourinary: Positive for vaginal bleeding (last period was 2 weeks of bleeding, started 1/3; abnormal for her) and menstrual problem (has regular period, expected to have started now but has not). Negative for dysuria and flank pain.  Musculoskeletal: Positive for back pain.  Skin: Negative for rash.  Neurological: Positive for dizziness. Negative for numbness.  All other systems reviewed and are negative.     Allergies  Review of patient's allergies indicates no known allergies.  Home Medications   Prior to Admission medications   Medication Sig Start Date End Date Taking? Authorizing Provider  calcium carbonate (TUMS - DOSED IN MG ELEMENTAL CALCIUM) 500 MG chewable tablet Chew 2 tablets by mouth daily as needed for heartburn.    Historical Provider, MD  ibuprofen (ADVIL,MOTRIN) 600 MG tablet Take 1 tablet (600 mg total) by mouth every 6 (six) hours. 04/06/13   Haroldine Laws, CNM  norethindrone (ORTHO MICRONOR) 0.35 MG tablet Take 1 tablet (0.35 mg total) by mouth daily. 04/06/13   Haroldine Laws, CNM  oxyCODONE-acetaminophen (PERCOCET/ROXICET) 5-325 MG per tablet Take 1-2 tablets by mouth every 4 (four) hours as needed. 04/06/13   Haroldine Laws, CNM  Prenatal Vit-Fe Fumarate-FA (PRENATAL MULTIVITAMIN) TABS Take 1 tablet by mouth daily at 12 noon.    Historical Provider, MD   BP 109/67 mmHg  Pulse 76  Temp(Src) 98.7 F (37.1 C) (Oral)  Resp 16  SpO2 100%  LMP 09/25/2014 Physical Exam  Constitutional: She is oriented to person, place, and time. She appears well-developed  and well-nourished.  HENT:  Head: Normocephalic and atraumatic.  Eyes: Conjunctivae and EOM are normal. Pupils are equal, round, and reactive to light.  Neck: Normal range of motion. Neck supple.  Cardiovascular: Normal rate, regular rhythm, normal heart sounds and intact distal pulses.  Exam reveals no gallop and no friction rub.   No murmur heard. Pulmonary/Chest: Effort normal and breath sounds normal. No respiratory distress. She has no wheezes. She has no rales. She exhibits tenderness (sternum, lower costochondral joints).  Abdominal: Soft. Bowel sounds are normal. She exhibits no distension. There is tenderness (epigastric area, along costal margin). There is no rebound and no guarding.  Musculoskeletal: She exhibits no edema or tenderness.  Neurological: She is alert and oriented to person, place, and time. No cranial nerve deficit.  Skin: Skin is warm and dry. No rash (no rashes over back, chest) noted.  Psychiatric: She has a normal mood  and affect. Her behavior is normal. Judgment and thought content normal.  Nursing note and vitals reviewed.   ED Course  Procedures (including critical care time) Labs Review Labs Reviewed  BASIC METABOLIC PANEL  CBC WITH DIFFERENTIAL/PLATELET  Rosezena Sensor, ED  POC URINE PREG, ED   Labs unremarkable  Imaging Review Dg Chest 2 View  10/27/2014   CLINICAL DATA:  Sharp in character chest pain for 2 days  EXAM: CHEST  2 VIEW  COMPARISON:  May 01, 2008  FINDINGS: There is no edema or consolidation. Heart size and pulmonary vascularity are normal. No adenopathy. No pneumothorax. No bone lesions. There is slight upper thoracic levoscoliosis.  IMPRESSION: No edema or consolidation.   Electronically Signed   By: Bretta Bang M.D.   On: 10/27/2014 12:25     EKG Interpretation None     EKG from urgent care reviewed, normal.  MDM   Final diagnoses:  Costochondritis   2:46 PM Vitals stable. No O2 requirement. CXR, EKG, BMP, CBC  unremarkable, negative i-stat troponin. Negative urine preg. Cannot use PERC due to birth control use, Wells score 0. Favor costochondritis as cause for pain. Obtain d-dimer.  4:27 PM D-dimer negative. Pt not having any pain currently, she feels ready to go home. Stable for discharge.   Nani Ravens, MD 10/27/14 1628  Nelia Shi, MD 10/28/14 [phone]

## 2014-10-27 NOTE — ED Provider Notes (Signed)
CSN: [id number]     Arrival date & time 10/27/14  1008 History   First MD Initiated Contact with Patient 10/27/14 1021     Chief Complaint  Patient presents with  . Chest Pain  . Shortness of Breath   (Consider location/radiation/quality/duration/timing/severity/associated sxs/prior Treatment) HPI  She is a 29 year old woman here for evaluation of chest pains.  They started about 3 days ago. She states it is located Network engineerepigastrically and sternally. It will radiate through her ribs and into her back. It will sometimes be triggered by a deep breath, sometimes by coughing, sometimes by eating, but nothing consistently. She has tried Tylenol and ibuprofen at home without improvement. The pain will make her short of breath. She also reports some shortness of breath when she is up moving around. This occurred in the setting of a cold, but she states her cold symptoms are improving. She reports some mild nausea with the pain, but denies any vomiting. She is taking combined OCPs currently, she states she has been on 3 different kinds in the last several months. Her grandmother had blood clots in the setting of malignancy.  Past Medical History  Diagnosis Date  . Bleeding nose   . Abnormal Pap smear 2006, 2012, 2013    Colpo done, rpt paps, Last pap 06/2012 was normal  . Infection     UTI;frequently in the past, none in 3-4 years  . Blood type, Rh negative    Past Surgical History  Procedure Laterality Date  . Dilation and curettage of uterus  2011    d/t molar pregnancy  . Cosmetic surgery      birth mark removal in the 4th grade   Family History  Problem Relation Age of Onset  . Other Maternal Grandmother     Varicose veins  . Breast cancer Mother     Menopausal  . Breast cancer Maternal Grandmother     Menopausal  . Bipolar disorder Brother    History  Substance Use Topics  . Smoking status: Former Smoker    Quit date: 07/24/2012  . Smokeless tobacco: Never Used  . Alcohol Use: No     Comment: Prior to pregnancy 1 beer/night   OB History    Gravida Para Term Preterm AB TAB SAB Ectopic Multiple Living   4 3 3  1  1   3      Review of Systems  Constitutional: Negative for fever, chills, activity change and appetite change.  HENT: Negative for congestion and rhinorrhea.   Respiratory: Positive for cough and shortness of breath.   Cardiovascular: Positive for chest pain. Negative for palpitations and leg swelling.  Gastrointestinal: Positive for nausea. Negative for vomiting.    Allergies  Review of patient's allergies indicates no known allergies.  Home Medications   Prior to Admission medications   Medication Sig Start Date End Date Taking? Authorizing Provider  norethindrone (ORTHO MICRONOR) 0.35 MG tablet Take 1 tablet (0.35 mg total) by mouth daily. 04/06/13  Yes Haroldine LawsJennifer Oxley, CNM  calcium carbonate (TUMS - DOSED IN MG ELEMENTAL CALCIUM) 500 MG chewable tablet Chew 2 tablets by mouth daily as needed for heartburn.    Historical Provider, MD  ibuprofen (ADVIL,MOTRIN) 600 MG tablet Take 1 tablet (600 mg total) by mouth every 6 (six) hours. 04/06/13   Haroldine LawsJennifer Oxley, CNM  oxyCODONE-acetaminophen (PERCOCET/ROXICET) 5-325 MG per tablet Take 1-2 tablets by mouth every 4 (four) hours as needed. 04/06/13   Haroldine LawsJennifer Oxley, CNM  Prenatal Vit-Fe Fumarate-FA (PRENATAL MULTIVITAMIN)  TABS Take 1 tablet by mouth daily at 12 noon.    Historical Provider, MD   BP 108/75 mmHg  Pulse 75  Temp(Src) 98.6 F (37 C) (Oral)  Resp 16  SpO2 100%  LMP 09/25/2014 Physical Exam  Constitutional: She is oriented to person, place, and time. She appears well-developed and well-nourished.  Sits stiffly, and moves cautiously.  HENT:  Head: Normocephalic and atraumatic.  Nose: Nose normal.  Mouth/Throat: Oropharynx is clear and moist. No oropharyngeal exudate.  Eyes: Conjunctivae are normal.  Neck: Neck supple.  Cardiovascular: Normal rate, regular rhythm and normal heart sounds.   No  murmur heard. Pulmonary/Chest: Effort normal and breath sounds normal. No respiratory distress. She has no wheezes. She has no rales. She exhibits tenderness (at xyphoid, but different from her pain).  Abdominal: Soft. Bowel sounds are normal. She exhibits no distension. There is tenderness (epigastric, but different from her pain). There is no rebound and no guarding.  Musculoskeletal: She exhibits no edema or tenderness.  Lymphadenopathy:    She has no cervical adenopathy.  Neurological: She is alert and oriented to person, place, and time.    ED Course  ED EKG  Date/Time: 10/27/2014 10:47 AM Performed by: Charm Rings Authorized by: Charm Rings Interpreted by ED physician Previous ECG: no previous ECG available Rhythm: sinus rhythm Rate: normal BPM: 73 QRS axis: normal Conduction: conduction normal ST Segments: ST segments normal T Waves: T waves normal Clinical impression: normal ECG   (including critical care time) Labs Review Labs Reviewed  POCT PREGNANCY, URINE    Imaging Review No results found.   MDM   1. Other chest pain    Her EKG is normal and her vital signs are stable. She does have some tenderness to palpation of the xiphoid and epigastric area, however she states this is different from the pains she is having. As she is taking exogenous estrogen, I am unable to rule out PE with PERC rule.  She also does have a family history of blood clot in her grandmother. Differential also includes pneumonia, pleurisy, gastritis, pancreatitis. We'll transfer to [facility] ER via shuttle for additional workup and management.    Charm Rings, MD 10/27/14 [phone]

## 2014-10-27 NOTE — ED Notes (Signed)
C/o  Chest pain with sob.   Pain and pressure felt in center of chest at sternum.  Pain is sharp at times radiating through to back.   Pain is worse with deep breathing.   Mild nausea.  Denies vomiting and diaphoresis.  No cardiac hx.  Symptoms present since 2/1.  No relief with otc meds.

## 2014-10-27 NOTE — ED Notes (Signed)
[facility] around rib area worse with inspiration and cough; pt sts intermittent x 3 weeks

## 2014-10-27 NOTE — ED Notes (Signed)
Pt from home for eval of cough x3 days and cp x2 days, pt reports green sputum with cough and also reports cp is intermittent. Reports nausea and dizziness and sob with cp. NAD noted. Denies any swelling to ankles or legs.

## 2015-01-19 ENCOUNTER — Emergency Department (HOSPITAL_COMMUNITY)
Admission: EM | Admit: 2015-01-19 | Discharge: 2015-01-19 | Disposition: A | Discharge status: Home / Self Care | Payer: Medicaid Other | Source: Home / Self Care | Attending: Emergency Medicine | Admitting: Emergency Medicine

## 2015-01-19 ENCOUNTER — Encounter (HOSPITAL_COMMUNITY): Payer: Self-pay | Admitting: Emergency Medicine

## 2015-01-19 DIAGNOSIS — M791 Myalgia, unspecified site: Secondary | ICD-10-CM

## 2015-01-19 DIAGNOSIS — M546 Pain in thoracic spine: Secondary | ICD-10-CM

## 2015-01-19 DIAGNOSIS — M79602 Pain in left arm: Secondary | ICD-10-CM

## 2015-01-19 DIAGNOSIS — M79605 Pain in left leg: Secondary | ICD-10-CM | POA: Diagnosis not present

## 2015-01-19 NOTE — Discharge Instructions (Signed)
Back Pain, Adult Back pain is very common. The pain often gets better over time. The cause of back pain is usually not dangerous. Most people can learn to manage their back pain on their own.  HOME CARE   Stay active. Start with short walks on flat ground if you can. Try to walk farther each day.  Do not sit, drive, or stand in one place for more than 30 minutes. Do not stay in bed.  Do not avoid exercise or work. Activity can help your back heal faster.  Be careful when you bend or lift an object. Bend at your knees, keep the object close to you, and do not twist.  Sleep on a firm mattress. Lie on your side, and bend your knees. If you lie on your back, put a pillow under your knees.  Only take medicines as told by your doctor.  Put ice on the injured area.  Put ice in a plastic bag.  Place a towel between your skin and the bag.  Leave the ice on for 15-20 minutes, 03-04 times a day for the first 2 to 3 days. After that, you can switch between ice and heat packs.  Ask your doctor about back exercises or massage.  Avoid feeling anxious or stressed. Find good ways to deal with stress, such as exercise. GET HELP RIGHT AWAY IF:   Your pain does not go away with rest or medicine.  Your pain does not go away in 1 week.  You have new problems.  You do not feel well.  The pain spreads into your legs.  You cannot control when you poop (bowel movement) or pee (urinate).  Your arms or legs feel weak or lose feeling (numbness).  You feel sick to your stomach (nauseous) or throw up (vomit).  You have belly (abdominal) pain.  You feel like you may pass out (faint). MAKE SURE YOU:   Understand these instructions.  Will watch your condition.  Will get help right away if you are not doing well or get worse. Document Released: 02/26/2008 Document Revised: 12/02/2011 Document Reviewed: 01/11/2014 [facility] Patient Information 2015 [place], [place]. This information is not intended  to replace advice given to you by your health care provider. Make sure you discuss any questions you have with your health care provider.  Muscle Pain Muscle pain (myalgia) may be caused by many things, including:  Overuse or muscle strain, especially if you are not in shape. This is the most common cause of muscle pain.  Injury.  Bruises.  Viruses, such as the flu.  Infectious diseases.  Fibromyalgia, which is a chronic condition that causes muscle tenderness, fatigue, and headache.  Autoimmune diseases, including lupus.  Certain drugs, including ACE inhibitors and statins. Muscle pain may be mild or severe. In most cases, the pain lasts only a short time and goes away without treatment. To diagnose the cause of your muscle pain, your health care provider will take your medical history. This means he or she will ask you when your muscle pain began and what has been happening. If you have not had muscle pain for very long, your health care provider may want to wait before doing much testing. If your muscle pain has lasted a long time, your health care provider may want to run tests right away. If your health care provider thinks your muscle pain may be caused by illness, you may need to have additional tests to rule out certain conditions.  Treatment for  muscle pain depends on the cause. Home care is often enough to relieve muscle pain. Your health care provider may also prescribe anti-inflammatory medicine. HOME CARE INSTRUCTIONS Watch your condition for any changes. The following actions may help to lessen any discomfort you are feeling:  Only take over-the-counter or prescription medicines as directed by your health care provider.  Apply ice to the sore muscle:  Put ice in a plastic bag.  Place a towel between your skin and the bag.  Leave the ice on for 15-20 minutes, 3-4 times a day.  You may alternate applying hot and cold packs to the muscle as directed by your health care  provider.  If overuse is causing your muscle pain, slow down your activities until the pain goes away.  Remember that it is normal to feel some muscle pain after starting a workout program. Muscles that have not been used often will be sore at first.  Do regular, gentle exercises if you are not usually active.  Warm up before exercising to lower your risk of muscle pain.  Do not continue working out if the pain is very bad. Bad pain could mean you have injured a muscle. SEEK MEDICAL CARE IF:  Your muscle pain gets worse, and medicines do not help.  You have muscle pain that lasts longer than 3 days.  You have a rash or fever along with muscle pain.  You have muscle pain after a tick bite.  You have muscle pain while working out, even though you are in good physical condition.  You have redness, soreness, or swelling along with muscle pain.  You have muscle pain after starting a new medicine or changing the dose of a medicine. SEEK IMMEDIATE MEDICAL CARE IF:  You have trouble breathing.  You have trouble swallowing.  You have muscle pain along with a stiff neck, fever, and vomiting.  You have severe muscle weakness or cannot move part of your body. MAKE SURE YOU:   Understand these instructions.  Will watch your condition.  Will get help right away if you are not doing well or get worse. Document Released: 08/01/2006 Document Revised: 09/14/2013 Document Reviewed: 07/06/2013 [facility] Patient Information 2015 [place], [place]. This information is not intended to replace advice given to you by your health care provider. Make sure you discuss any questions you have with your health care provider.  Musculoskeletal Pain Musculoskeletal pain is muscle and boney aches and pains. These pains can occur in any part of the body. Your caregiver may treat you without knowing the cause of the pain. They may treat you if blood or urine tests, X-rays, and other tests were normal.   CAUSES There is often not a definite cause or reason for these pains. These pains may be caused by a type of germ (virus). The discomfort may also come from overuse. Overuse includes working out too hard when your body is not fit. Boney aches also come from weather changes. Bone is sensitive to atmospheric pressure changes. HOME CARE INSTRUCTIONS   Ask when your test results will be ready. Make sure you get your test results.  Only take over-the-counter or prescription medicines for pain, discomfort, or fever as directed by your caregiver. If you were given medications for your condition, do not drive, operate machinery or power tools, or sign legal documents for 24 hours. Do not drink alcohol. Do not take sleeping pills or other medications that may interfere with treatment.  Continue all activities unless the activities cause  more pain. When the pain lessens, slowly resume normal activities. Gradually increase the intensity and duration of the activities or exercise.  During periods of severe pain, bed rest may be helpful. Lay or sit in any position that is comfortable.  Putting ice on the injured area.  Put ice in a bag.  Place a towel between your skin and the bag.  Leave the ice on for 15 to 20 minutes, 3 to 4 times a day.  Follow up with your caregiver for continued problems and no reason can be found for the pain. If the pain becomes worse or does not go away, it may be necessary to repeat tests or do additional testing. Your caregiver may need to look further for a possible cause. SEEK IMMEDIATE MEDICAL CARE IF:  You have pain that is getting worse and is not relieved by medications.  You develop chest pain that is associated with shortness or breath, sweating, feeling sick to your stomach (nauseous), or throw up (vomit).  Your pain becomes localized to the abdomen.  You develop any new symptoms that seem different or that concern you. MAKE SURE YOU:   Understand these  instructions.  Will watch your condition.  Will get help right away if you are not doing well or get worse. Document Released: 09/09/2005 Document Revised: 12/02/2011 Document Reviewed: 05/14/2013 [facility] Patient Information 2015 [place]. This information is not intended to replace advice given to you by your health care provider. Make sure you discuss any questions you have with your health care provider.

## 2015-01-19 NOTE — ED Notes (Signed)
C/o left sided body pain.   Having pain in face, arm, lower back, and neck.   Denies injury.  No relief with ibuprofen.

## 2015-01-19 NOTE — ED Provider Notes (Signed)
CSN: [id number]     Arrival date & time 01/19/15  1243 History   First MD Initiated Contact with Patient 01/19/15 1500     Chief Complaint  Patient presents with  . Numbness  . Chest Pain   (Consider location/radiation/quality/duration/timing/severity/associated sxs/prior Treatment) HPI Comments: 29 year old female who states she is [redacted] weeks pregnant is complaining of migrating pains to the left neck, face, left outer arm and left parathoracic areas. 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 Denies any known trauma.   Past Medical History  Diagnosis Date  . Bleeding nose   . Abnormal Pap smear 2006, 2012, 2013    Colpo done, rpt paps, Last pap 06/2012 was normal  . Infection     UTI;frequently in the past, none in 3-4 years  . Blood type, Rh negative    Past Surgical History  Procedure Laterality Date  . Dilation and curettage of uterus  2011    d/t molar pregnancy  . Cosmetic surgery      birth mark removal in the 4th grade   Family History  Problem Relation Age of Onset  . Other Maternal Grandmother     Varicose veins  . Breast cancer Mother     Menopausal  . Breast cancer Maternal Grandmother     Menopausal  . Bipolar disorder Brother    History  Substance Use Topics  . Smoking status: Former Smoker    Quit date: 07/24/2012  . Smokeless tobacco: Never Used  . Alcohol Use: No     Comment: Prior to pregnancy 1 beer/night   OB History    Gravida Para Term Preterm AB TAB SAB Ectopic Multiple Living   Review of Systems  Constitutional: Negative.   HENT: Negative.   Respiratory: Negative.   Gastrointestinal: Negative.   Genitourinary: Negative for dysuria, urgency, frequency, hematuria, flank pain, vaginal bleeding, menstrual problem and pelvic pain.  Musculoskeletal: Positive for myalgias, back pain and neck pain. Negative for joint  swelling, arthralgias and neck stiffness.  Skin: Negative for color change, pallor and rash.  Neurological: Negative.   Psychiatric/Behavioral: Negative for behavioral problems and agitation.    Allergies  Review of patient's allergies indicates no known allergies.  Home Medications   Prior to Admission medications   Medication Sig Start Date End Date Taking? Authorizing Provider  ibuprofen (ADVIL,MOTRIN) 600 MG tablet Take 1 tablet (600 mg total) by mouth every 6 (six) hours. 04/06/13  Yes Haroldine Laws, CNM  norethindrone (ORTHO MICRONOR) 0.35 MG tablet Take 1 tablet (0.35 mg total) by mouth daily. Patient not taking: Reported on 10/27/2014 04/06/13   Haroldine Laws, CNM  oxyCODONE-acetaminophen (PERCOCET/ROXICET) 5-325 MG per tablet Take 1-2 tablets by mouth every 4 (four) hours as needed. Patient not taking: Reported on 10/27/2014 04/06/13   Haroldine Laws, CNM  TRINESSA, 28, 0.18/0.215/0.25 MG-35 MCG tablet Take 1 tablet by mouth daily. 10/03/14   Historical Provider, MD   BP 114/69 mmHg  Pulse 69  Temp(Src) 98.2 F (36.8 C) (Oral)  Resp 12  SpO2 100%  LMP  Physical Exam  Constitutional: She is oriented to person, place, and time. She appears well-developed and well-nourished. No distress.  Neck: Normal range of motion. Neck supple.  Cardiovascular: Normal rate and normal heart sounds.   No murmur heard. Pulmonary/Chest: Effort normal  and breath sounds normal. No respiratory distress. She has no wheezes. She has no rales.  Abdominal: Soft. There is no tenderness.  Musculoskeletal: She exhibits tenderness. She exhibits no edema.  There is tenderness to the left deltoid in the midportion of the triceps. His pain is reproduced with abduction against resistance. She has full range of motion of the left shoulder and arm. Distal neurovascular motor Sentry is intact. Circulation is normal. Radial pulse 2+. Capillary refill brisk. Normal warmth and color. There is tenderness to the left  upper parathoracic musculature with a palpable body spasm at approximately T3 and 4. Lesser pain to the lower parathoracic and upper lumbar musculature. Patient is thin ribs are palpable and there is some tenderness to the intercostal muscles. Good movement, flexion and extension to the spine without limitation. No cervical tenderness. No deformity to the spine or other areas. No swelling or discoloration.  Neurological: She is alert and oriented to person, place, and time.  Skin: Skin is warm.  Psychiatric: She has a normal mood and affect.  Nursing note and vitals reviewed.   ED Course  Procedures (including critical care time) Labs Review Labs Reviewed - No data to display  Imaging Review No results found.   MDM   1. Musculoskeletal arm pain, left   2. Left-sided thoracic back pain   3. Muscle pain     May use Tylenol for pain. Avoid NSAIDs due to pregnancy Use heat. Limit those activities that may increase pain. Reassurance.      Hayden Rasmussenavid Roshaunda Starkey, NP 01/19/15 1549

## 2015-04-27 ENCOUNTER — Inpatient Hospital Stay (HOSPITAL_COMMUNITY): Payer: Medicaid Other

## 2015-04-27 ENCOUNTER — Inpatient Hospital Stay (HOSPITAL_COMMUNITY)
Admission: AD | Admit: 2015-04-27 | Discharge: 2015-04-27 | Disposition: A | Discharge status: Home / Self Care | Payer: Medicaid Other | Source: Ambulatory Visit | Attending: Obstetrics and Gynecology | Admitting: Obstetrics and Gynecology

## 2015-04-27 ENCOUNTER — Encounter (HOSPITAL_COMMUNITY): Payer: Self-pay

## 2015-04-27 DIAGNOSIS — O209 Hemorrhage in early pregnancy, unspecified: Secondary | ICD-10-CM | POA: Insufficient documentation

## 2015-04-27 DIAGNOSIS — Z3A08 8 weeks gestation of pregnancy: Secondary | ICD-10-CM | POA: Insufficient documentation

## 2015-04-27 DIAGNOSIS — Z6791 Unspecified blood type, Rh negative: Secondary | ICD-10-CM | POA: Diagnosis present

## 2015-04-27 DIAGNOSIS — Z9889 Other specified postprocedural states: Secondary | ICD-10-CM

## 2015-04-27 DIAGNOSIS — Z87891 Personal history of nicotine dependence: Secondary | ICD-10-CM | POA: Insufficient documentation

## 2015-04-27 DIAGNOSIS — R41840 Attention and concentration deficit: Secondary | ICD-10-CM | POA: Diagnosis present

## 2015-04-27 LAB — URINALYSIS, ROUTINE W REFLEX MICROSCOPIC
Bilirubin Urine: NEGATIVE
Glucose, UA: NEGATIVE mg/dL
Hgb urine dipstick: NEGATIVE
Ketones, ur: NEGATIVE mg/dL
Leukocytes, UA: NEGATIVE
Nitrite: NEGATIVE
PROTEIN: NEGATIVE mg/dL
SPECIFIC GRAVITY, URINE: 1.01 (ref 1.005–1.030)
UROBILINOGEN UA: 0.2 mg/dL (ref 0.0–1.0)
pH: 5.5 (ref 5.0–8.0)

## 2015-04-27 LAB — HCG, QUANTITATIVE, PREGNANCY: hCG, Beta Chain, Quant, S: 115016 m[IU]/mL — ABNORMAL HIGH (ref <5)

## 2015-04-27 LAB — CBC
HCT: 36.5 % (ref 36.0–46.0)
Hemoglobin: 12.8 g/dL (ref 12.0–15.0)
MCH: 29.9 pg (ref 26.0–34.0)
MCHC: 35.1 g/dL (ref 30.0–36.0)
MCV: 85.3 fL (ref 78.0–100.0)
Platelets: 252 10*3/uL (ref 150–400)
RBC: 4.28 MIL/uL (ref 3.87–5.11)
RDW: 12.3 % (ref 11.5–15.5)
WBC: 11.5 10*3/uL — ABNORMAL HIGH (ref 4.0–10.5)

## 2015-04-27 LAB — RH IG WORKUP (INCLUDES ABO/RH)
ABO/RH(D): A NEG
GESTATIONAL AGE(WKS): 8

## 2015-04-27 LAB — WET PREP, GENITAL
CLUE CELLS WET PREP: NONE SEEN
Trich, Wet Prep: NONE SEEN
Yeast Wet Prep HPF POC: NONE SEEN

## 2015-04-27 LAB — POCT PREGNANCY, URINE: Preg Test, Ur: POSITIVE — AB

## 2015-04-27 MED ORDER — RHO D IMMUNE GLOBULIN 1500 UNIT/2ML IJ SOSY
300.0000 ug | PREFILLED_SYRINGE | Freq: Once | INTRAMUSCULAR | Status: DC
  Filled 2015-04-27: qty 2

## 2015-04-27 NOTE — Discharge Instructions (Signed)
Vaginal Bleeding During Pregnancy, First Trimester  A small amount of bleeding (spotting) from the vagina is relatively common in early pregnancy. It usually stops on its own. Various things may cause bleeding or spotting in early pregnancy. Some bleeding may be related to the pregnancy, and some may not. In most cases, the bleeding is normal and is not a problem. However, bleeding can also be a sign of something serious. Be sure to tell your health care provider about any vaginal bleeding right away.  Some possible causes of vaginal bleeding during the first trimester include:  · Infection or inflammation of the cervix.  · Growths (polyps) on the cervix.  · Miscarriage or threatened miscarriage.  · Pregnancy tissue has developed outside of the uterus and in a fallopian tube (tubal pregnancy).  · Tiny cysts have developed in the uterus instead of pregnancy tissue (molar pregnancy).  HOME CARE INSTRUCTIONS   Watch your condition for any changes. The following actions may help to lessen any discomfort you are feeling:  · Follow your health care provider's instructions for limiting your activity. If your health care provider orders bed rest, you may need to stay in bed and only get up to use the bathroom. However, your health care provider may allow you to continue light activity.  · If needed, make plans for someone to help with your regular activities and responsibilities while you are on bed rest.  · Keep track of the number of pads you use each day, how often you change pads, and how soaked (saturated) they are. Write this down.  · Do not use tampons. Do not douche.  · Do not have sexual intercourse or orgasms until approved by your health care provider.  · If you pass any tissue from your vagina, save the tissue so you can show it to your health care provider.  · Only take over-the-counter or prescription medicines as directed by your health care provider.  · Do not take aspirin because it can make you  bleed.  · Keep all follow-up appointments as directed by your health care provider.  SEEK MEDICAL CARE IF:  · You have any vaginal bleeding during any part of your pregnancy.  · You have cramps or labor pains.  · You have a fever, not controlled by medicine.  SEEK IMMEDIATE MEDICAL CARE IF:   · You have severe cramps in your back or belly (abdomen).  · You pass large clots or tissue from your vagina.  · Your bleeding increases.  · You feel light-headed or weak, or you have fainting episodes.  · You have chills.  · You are leaking fluid or have a gush of fluid from your vagina.  · You pass out while having a bowel movement.  MAKE SURE YOU:  · Understand these instructions.  · Will watch your condition.  · Will get help right away if you are not doing well or get worse.  Document Released: 06/19/2005 Document Revised: 09/14/2013 Document Reviewed: 05/17/2013  Exit[employer]® Patient Information ©2015 [employer]. This information is not intended to replace advice given to you by your health care provider. Make sure you discuss any questions you have with your health care provider.

## 2015-04-27 NOTE — MAU Provider Note (Signed)
History   28 you [id number] at approx 8 weeks presented after calling office with vaginal spotting today.  Hx SAB 01/2015, and molar pregnancy 2011.  Known A negative blood type.  Did receive Rhophylac 01/2015.    Denies recent IC.  Has very mild cramping.  Has NOB w/u scheduled 05/22/15.  Patient Active Problem List   Diagnosis Date Noted  . Blood type, Rh negative 04/27/2015  . ADD (attention deficit disorder) 04/27/2015  . H/O LEEP--2006 04/27/2015  . H/O molar pregnancy, antepartum 10/08/2012    Chief Complaint  Patient presents with  . Abdominal Pain   HPI:  As above  OB History    Gravida Para Term Preterm AB TAB SAB Ectopic Multiple Living   6 3 3  2  2   3       Past Medical History  Diagnosis Date  . Bleeding nose   . Abnormal Pap smear 2006, 2012, 2013    Colpo done, rpt paps, Last pap 06/2012 was normal  . Infection     UTI;frequently in the past, none in 3-4 years  . Blood type, Rh negative     Past Surgical History  Procedure Laterality Date  . Dilation and curettage of uterus  2011    d/t molar pregnancy  . Cosmetic surgery      birth mark removal in the 4th grade    Family History  Problem Relation Age of Onset  . Other Maternal Grandmother     Varicose veins  . Breast cancer Mother     Menopausal  . Breast cancer Maternal Grandmother     Menopausal  . Bipolar disorder Brother     History  Substance Use Topics  . Smoking status: Former Smoker    Quit date: 07/24/2012  . Smokeless tobacco: Never Used  . Alcohol Use: No     Comment: Prior to pregnancy 1 beer/night    Allergies: No Known Allergies  Prescriptions prior to admission  Medication Sig Dispense Refill Last Dose  . ibuprofen (ADVIL,MOTRIN) 600 MG tablet Take 1 tablet (600 mg total) by mouth every 6 (six) hours. 30 tablet 0 01/19/2015 at Unknown time  . norethindrone (ORTHO MICRONOR) 0.35 MG tablet Take 1 tablet (0.35 mg total) by mouth daily. (Patient not taking: Reported on  10/27/2014) 1 Package 11 Not Taking at Unknown time  . oxyCODONE-acetaminophen (PERCOCET/ROXICET) 5-325 MG per tablet Take 1-2 tablets by mouth every 4 (four) hours as needed. (Patient not taking: Reported on 10/27/2014) 30 tablet 0 Completed Course at Unknown time  . TRINESSA, 28, 0.18/0.215/0.25 MG-35 MCG tablet Take 1 tablet by mouth daily.  3 10/27/2014 at Unknown time    ROS:  Vaginal spotting Physical Exam   Blood pressure 118/59, pulse 64, temperature 98.5 F (36.9 C), temperature source Oral, resp. rate 18, height 5\' 7"  (1.702 m), weight 59.058 kg (130 lb 3.2 oz), last menstrual period 03/05/2015, unknown if currently breastfeeding.    Physical Exam  In NAD Chest clear Heart RRR without murmur Abd gravid, NT Pelvic--cervix slightly friable, closed, long, Nabothian cyst noted at 12 o'clock, small amount white d/c in vault. Ext WNL   ED Course  Assessment: Early pregnancy bleeding RH negative Hx recent SAB  Plan: QHCG CBC Rhophylac w/u and administration [place]   LATHAM, VICKI CNM, MSN 04/27/2015 1:20 PM  Addendum: Returned from [place]:  SIUP, 7 6/7 weeks, EDC 12/08/14, FHR 167, small SCH noted.  Results for orders placed or performed during the hospital encounter of  04/27/15 (from the past 24 hour(s))  Urinalysis, Routine w reflex microscopic (not at [facility])     Status: None   Collection Time: 04/27/15 12:36 PM  Result Value Ref Range   Color, Urine YELLOW YELLOW   APPearance CLEAR CLEAR   Specific Gravity, Urine 1.010 1.005 - 1.030   pH 5.5 5.0 - 8.0   Glucose, UA NEGATIVE NEGATIVE mg/dL   Hgb urine dipstick NEGATIVE NEGATIVE   Bilirubin Urine NEGATIVE NEGATIVE   Ketones, ur NEGATIVE NEGATIVE mg/dL   Protein, ur NEGATIVE NEGATIVE mg/dL   Urobilinogen, UA 0.2 0.0 - 1.0 mg/dL   Nitrite NEGATIVE NEGATIVE   Leukocytes, UA NEGATIVE NEGATIVE  Pregnancy, urine POC     Status: Abnormal   Collection Time: 04/27/15 12:46 PM  Result Value Ref Range   Preg Test, Ur POSITIVE (A)  NEGATIVE  Wet prep, genital     Status: Abnormal   Collection Time: 04/27/15 12:53 PM  Result Value Ref Range   Yeast Wet Prep HPF POC NONE SEEN NONE SEEN   Trich, Wet Prep NONE SEEN NONE SEEN   Clue Cells Wet Prep HPF POC NONE SEEN NONE SEEN   WBC, Wet Prep HPF POC FEW (A) NONE SEEN  hCG, quantitative, pregnancy     Status: Abnormal   Collection Time: 04/27/15  1:00 PM  Result Value Ref Range   hCG, Beta Chain, Quant, S 115016 (H) <5 mIU/mL  CBC     Status: Abnormal   Collection Time: 04/27/15  1:00 PM  Result Value Ref Range   WBC 11.5 (H) 4.0 - 10.5 K/uL   RBC 4.28 3.87 - 5.11 MIL/uL   Hemoglobin 12.8 12.0 - 15.0 g/dL   HCT 16.1 09.6 - 04.5 %   MCV 85.3 78.0 - 100.0 fL   MCH 29.9 26.0 - 34.0 pg   MCHC 35.1 30.0 - 36.0 g/dL   RDW 40.9 81.1 - 91.4 %   Platelets 252 150 - 400 K/uL  Rh IG workup (includes ABO/Rh)     Status: None (Preliminary result)   Collection Time: 04/27/15  1:00 PM  Result Value Ref Range   Gestational Age(Wks) 8    ABO/RH(D) A NEG    Antibody Screen PENDING    Verbal report from lab--Antibody screen positive, no need for Rhophylac today.  Impression: 1st trimester bleeding RH negative--antibody screen positive, no need for Rhophylac today Hx SAB x 2  Plan: D/C home with bleeding precautions Pelvic rest until NOB visit on 8/29. Call with any questions or concerns. Rhophylac at 30 weeks or prn.  Nigel Bridgeman, CNM 04/27/15 3:30p

## 2015-04-27 NOTE — MAU Note (Signed)
Pt presents complaining of vaginal spotting that started this am with mild abdominal pain. Denies abnormal discharge. LMP 03/05/2015

## 2015-04-28 LAB — GC/CHLAMYDIA PROBE AMP (~~LOC~~) NOT AT ARMC
Chlamydia: NEGATIVE
Neisseria Gonorrhea: NEGATIVE

## 2015-09-24 NOTE — L&D Delivery Note (Signed)
Delivery Note At 8:44 PM a viable female "Rance MuirCaroline Amelia" was delivered via Vaginal, Spontaneous Delivery (Presentation: OA restituting to Left Occiput Anterior).  APGARS: 8,9; weight 8lbs 0.9 oz (3655 g).   Placenta status: Intact, Spontaneous Schultz. Cord: 3 vessels with the following complications: None.  Cord pH: NA  Anesthesia: Local & 100 mcg Fentanyl for repair Episiotomy: None Lacerations: 2nd degree Vaginal Suture Repair: 3.0 vicryl CT and SH Est. Blood Loss (mL): 200   Mom to postpartum.  Baby to Couplet care / Skin to Skin.  Mom plans to breastfeed.  Plans Micronor for contraception.  Sherre ScarletWILLIAMS, Toshia Larkin 12/08/2015, 9:50 PM

## 2015-12-08 ENCOUNTER — Encounter (HOSPITAL_COMMUNITY): Payer: Self-pay | Admitting: *Deleted

## 2015-12-08 ENCOUNTER — Inpatient Hospital Stay (HOSPITAL_COMMUNITY)
Admission: AD | Admit: 2015-12-08 | Discharge: 2015-12-10 | DRG: 775 | Disposition: A | Discharge status: Home / Self Care | Payer: Medicaid Other | Source: Ambulatory Visit | Attending: Obstetrics and Gynecology | Admitting: Obstetrics and Gynecology

## 2015-12-08 DIAGNOSIS — Z807 Family history of other malignant neoplasms of lymphoid, hematopoietic and related tissues: Secondary | ICD-10-CM | POA: Diagnosis not present

## 2015-12-08 DIAGNOSIS — Z6791 Unspecified blood type, Rh negative: Secondary | ICD-10-CM | POA: Diagnosis not present

## 2015-12-08 DIAGNOSIS — O4292 Full-term premature rupture of membranes, unspecified as to length of time between rupture and onset of labor: Secondary | ICD-10-CM | POA: Diagnosis present

## 2015-12-08 DIAGNOSIS — Z87891 Personal history of nicotine dependence: Secondary | ICD-10-CM

## 2015-12-08 DIAGNOSIS — Z801 Family history of malignant neoplasm of trachea, bronchus and lung: Secondary | ICD-10-CM | POA: Diagnosis not present

## 2015-12-08 DIAGNOSIS — O9081 Anemia of the puerperium: Secondary | ICD-10-CM | POA: Diagnosis not present

## 2015-12-08 DIAGNOSIS — O4202 Full-term premature rupture of membranes, onset of labor within 24 hours of rupture: Principal | ICD-10-CM | POA: Diagnosis present

## 2015-12-08 DIAGNOSIS — F988 Other specified behavioral and emotional disorders with onset usually occurring in childhood and adolescence: Secondary | ICD-10-CM | POA: Diagnosis present

## 2015-12-08 DIAGNOSIS — Z8249 Family history of ischemic heart disease and other diseases of the circulatory system: Secondary | ICD-10-CM | POA: Diagnosis not present

## 2015-12-08 DIAGNOSIS — O26893 Other specified pregnancy related conditions, third trimester: Secondary | ICD-10-CM | POA: Diagnosis present

## 2015-12-08 DIAGNOSIS — O99344 Other mental disorders complicating childbirth: Secondary | ICD-10-CM | POA: Diagnosis present

## 2015-12-08 DIAGNOSIS — Z3A39 39 weeks gestation of pregnancy: Secondary | ICD-10-CM | POA: Diagnosis not present

## 2015-12-08 DIAGNOSIS — D649 Anemia, unspecified: Secondary | ICD-10-CM | POA: Diagnosis present

## 2015-12-08 DIAGNOSIS — IMO0001 Reserved for inherently not codable concepts without codable children: Secondary | ICD-10-CM

## 2015-12-08 DIAGNOSIS — Z803 Family history of malignant neoplasm of breast: Secondary | ICD-10-CM | POA: Diagnosis not present

## 2015-12-08 LAB — CBC
HCT: 33.3 % — ABNORMAL LOW (ref 36.0–46.0)
HEMOGLOBIN: 11.5 g/dL — AB (ref 12.0–15.0)
MCH: 29.9 pg (ref 26.0–34.0)
MCHC: 34.5 g/dL (ref 30.0–36.0)
MCV: 86.5 fL (ref 78.0–100.0)
Platelets: 241 10*3/uL (ref 150–400)
RBC: 3.85 MIL/uL — AB (ref 3.87–5.11)
RDW: 13.5 % (ref 11.5–15.5)
WBC: 13.8 10*3/uL — AB (ref 4.0–10.5)

## 2015-12-08 LAB — OB RESULTS CONSOLE RUBELLA ANTIBODY, IGM: RUBELLA: IMMUNE

## 2015-12-08 LAB — OB RESULTS CONSOLE HEPATITIS B SURFACE ANTIGEN: Hepatitis B Surface Ag: NEGATIVE

## 2015-12-08 LAB — OB RESULTS CONSOLE RPR: RPR: NONREACTIVE

## 2015-12-08 LAB — OB RESULTS CONSOLE GBS: GBS: NEGATIVE

## 2015-12-08 LAB — OB RESULTS CONSOLE GC/CHLAMYDIA
Chlamydia: NEGATIVE
Gonorrhea: NEGATIVE

## 2015-12-08 LAB — OB RESULTS CONSOLE HIV ANTIBODY (ROUTINE TESTING): HIV: NONREACTIVE

## 2015-12-08 LAB — OB RESULTS CONSOLE ABO/RH: RH TYPE: NEGATIVE

## 2015-12-08 LAB — OB RESULTS CONSOLE ANTIBODY SCREEN: Antibody Screen: NEGATIVE

## 2015-12-08 MED ORDER — BENZOCAINE-MENTHOL 20-0.5 % EX AERO
1.0000 "application " | INHALATION_SPRAY | CUTANEOUS | Status: DC | PRN
  Filled 2015-12-08 (×2): qty 56

## 2015-12-08 MED ORDER — ONDANSETRON HCL 4 MG PO TABS: 4.0000 mg | ORAL_TABLET | ORAL | Status: DC | PRN

## 2015-12-08 MED ORDER — PRENATAL MULTIVITAMIN CH
1.0000 | ORAL_TABLET | Freq: Every day | ORAL | Status: DC
  Administered 2015-12-09 – 2015-12-10 (×2): 1 via ORAL
  Filled 2015-12-08 (×2): qty 1

## 2015-12-08 MED ORDER — OXYTOCIN 10 UNIT/ML IJ SOLN
2.5000 [IU]/h | INTRAVENOUS | Status: DC
  Filled 2015-12-08: qty 10

## 2015-12-08 MED ORDER — LACTATED RINGERS IV SOLN: INTRAVENOUS | Status: DC

## 2015-12-08 MED ORDER — LANOLIN HYDROUS EX OINT: TOPICAL_OINTMENT | CUTANEOUS | Status: DC | PRN

## 2015-12-08 MED ORDER — FENTANYL CITRATE (PF) 100 MCG/2ML IJ SOLN
100.0000 ug | Freq: Once | INTRAMUSCULAR | Status: AC
  Administered 2015-12-08: 100 ug via INTRAVENOUS
  Filled 2015-12-08: qty 2

## 2015-12-08 MED ORDER — ACETAMINOPHEN 325 MG PO TABS: 650.0000 mg | ORAL_TABLET | ORAL | Status: DC | PRN

## 2015-12-08 MED ORDER — OXYCODONE-ACETAMINOPHEN 5-325 MG PO TABS
1.0000 | ORAL_TABLET | ORAL | Status: DC | PRN
  Administered 2015-12-08 – 2015-12-10 (×5): 1 via ORAL
  Filled 2015-12-08 (×5): qty 1

## 2015-12-08 MED ORDER — OXYCODONE-ACETAMINOPHEN 5-325 MG PO TABS: 1.0000 | ORAL_TABLET | ORAL | Status: DC | PRN

## 2015-12-08 MED ORDER — WITCH HAZEL-GLYCERIN EX PADS: 1.0000 "application " | MEDICATED_PAD | CUTANEOUS | Status: DC | PRN

## 2015-12-08 MED ORDER — TETANUS-DIPHTH-ACELL PERTUSSIS 5-2.5-18.5 LF-MCG/0.5 IM SUSP
0.5000 mL | Freq: Once | INTRAMUSCULAR | Status: DC
  Filled 2015-12-08: qty 0.5

## 2015-12-08 MED ORDER — LIDOCAINE HCL (PF) 1 % IJ SOLN
30.0000 mL | INTRAMUSCULAR | Status: AC | PRN
  Administered 2015-12-08: 30 mL via SUBCUTANEOUS
  Filled 2015-12-08: qty 30

## 2015-12-08 MED ORDER — LACTATED RINGERS IV SOLN
500.0000 mL | INTRAVENOUS | Status: DC | PRN
  Administered 2015-12-08: 500 mL via INTRAVENOUS

## 2015-12-08 MED ORDER — DIPHENHYDRAMINE HCL 25 MG PO CAPS: 25.0000 mg | ORAL_CAPSULE | Freq: Four times a day (QID) | ORAL | Status: DC | PRN

## 2015-12-08 MED ORDER — DIBUCAINE 1 % RE OINT
1.0000 "application " | TOPICAL_OINTMENT | RECTAL | Status: DC | PRN
  Filled 2015-12-08: qty 28

## 2015-12-08 MED ORDER — OXYCODONE-ACETAMINOPHEN 5-325 MG PO TABS: 2.0000 | ORAL_TABLET | ORAL | Status: DC | PRN

## 2015-12-08 MED ORDER — OXYTOCIN BOLUS FROM INFUSION
500.0000 mL | INTRAVENOUS | Status: DC
  Administered 2015-12-08: 500 mL via INTRAVENOUS

## 2015-12-08 MED ORDER — SENNOSIDES-DOCUSATE SODIUM 8.6-50 MG PO TABS
2.0000 | ORAL_TABLET | ORAL | Status: DC
  Administered 2015-12-08 – 2015-12-10 (×2): 2 via ORAL
  Filled 2015-12-08 (×2): qty 2

## 2015-12-08 MED ORDER — ZOLPIDEM TARTRATE 5 MG PO TABS: 5.0000 mg | ORAL_TABLET | Freq: Every evening | ORAL | Status: DC | PRN

## 2015-12-08 MED ORDER — RHO D IMMUNE GLOBULIN 1500 UNIT/2ML IJ SOSY
300.0000 ug | PREFILLED_SYRINGE | Freq: Once | INTRAMUSCULAR | Status: AC
  Administered 2015-12-09: 300 ug via INTRAVENOUS
  Filled 2015-12-08: qty 2

## 2015-12-08 MED ORDER — CITRIC ACID-SODIUM CITRATE 334-500 MG/5ML PO SOLN: 30.0000 mL | ORAL | Status: DC | PRN

## 2015-12-08 MED ORDER — SIMETHICONE 80 MG PO CHEW: 80.0000 mg | CHEWABLE_TABLET | ORAL | Status: DC | PRN

## 2015-12-08 MED ORDER — ONDANSETRON HCL 4 MG/2ML IJ SOLN: 4.0000 mg | INTRAMUSCULAR | Status: DC | PRN

## 2015-12-08 MED ORDER — IBUPROFEN 600 MG PO TABS
600.0000 mg | ORAL_TABLET | Freq: Four times a day (QID) | ORAL | Status: DC
  Administered 2015-12-08 – 2015-12-10 (×8): 600 mg via ORAL
  Filled 2015-12-08 (×8): qty 1

## 2015-12-08 MED ORDER — ONDANSETRON HCL 4 MG/2ML IJ SOLN: 4.0000 mg | Freq: Four times a day (QID) | INTRAMUSCULAR | Status: DC | PRN

## 2015-12-08 NOTE — MAU Note (Signed)
Urine in lab 

## 2015-12-08 NOTE — MAU Note (Signed)
Pt states started contracting at 0330AM and water broke at 2:30PM.  States contractions are 5-27min apart

## 2015-12-08 NOTE — H&P (Signed)
Jane Foster is a 30 y.o. female, [phone] at 39.5 weeks, presenting after calling the office w/ ROM (clear fluid at 1430) and UCs q 5-18min, worse since ROM. Endorses FM. Denies VB.   Patient Active Problem List   Diagnosis Date Noted  . Active labor 12/08/2015  . Vaginal delivery 12/08/2015  . Second-degree perineal laceration, with delivery 12/08/2015  . Blood type, Rh negative 04/27/2015  . ADD (attention deficit disorder) 04/27/2015  . H/O LEEP--2006 04/27/2015  . H/O molar pregnancy, antepartum 10/08/2012    History of present pregnancy: Patient entered care at 10.1 weeks.   EDC of 12/10/15 was established by sure LMP.   Anatomy scan: 18.2 weeks, with normal findings and a posterior placenta.   Additional US evaluations: [id number] (vaginal bleeding): Intrauterine gestational sac, yolk sac, fetal pole, and cardiac activity, with gestational age 687 weeks 6 days concordant w/ assigned gestational age by LMP. Trace SCH.   Significant prenatal events: Planned pregnancy with good support. Declined genetic testing. Received flu vaccine on 06/16/15 and Tdap on 09/27/15. Received Rhogam 10/03/15 @ 30.2 wks. Normal pregnancy discomforts - relief measures reviewed. Experienced ctxs around 30+ wks; NST Cat 1, UA and urine culture neg; preterm precautions reviewed. No MAU visits. TWG 30 lbs.      Last evaluation: Office 12/07/15 by Dr. Normand Foster at 39.4 wks. Cvx 3/70/-2. BP 110/60. Wt: 156 lbs.  OB History    Gravida Para Term Preterm AB TAB SAB Ectopic Multiple Living   6 4 4  2  2   0 4    SVB 07/15/07 @ 39 wks, female infant, birthwt 7+5 -- [facility] [place] S[place] 2011 @ 9 wks SVB 05/28/11 @ 39 wks, female infant, birthwt 7+12 -- [facility] SVB 04/04/13 @ 40 wks, female infant, birthwt 9+6  Past Medical History  Diagnosis Date  . Bleeding nose   . Abnormal Pap smear 2006, 2012, 2013    Colpo done, rpt paps, Last pap 06/2012 was normal  . Infection      UTI;frequently in the past, none in 3-4 years  . Blood type, Rh negative    Past Surgical History  Procedure Laterality Date  . Dilation and curettage of uterus  2011    d/t molar pregnancy  . Cosmetic surgery      birth mark removal in the 4th grade   Family History: family history includes Bipolar disorder in her brother; Breast cancer in her maternal grandmother and mother; Other in her maternal grandmother.Lung cancer in her MGM, MI in her MGF, mental disorder in her father, non-Hodgkin's lymphoma (clinical) in her MGM, and alcoholism in her PGM and PGF. Social History:  reports that she quit smoking about 3 years ago. She has never used smokeless tobacco. She reports that she does not drink alcohol or use illicit drugs. 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 She will accept blood in an emergency.    Prenatal Transfer Tool  Maternal Diabetes: No Genetic Screening: Declined Maternal Ultrasounds/Referrals: Normal Fetal Ultrasounds or other Referrals:  None Maternal Substance Abuse:  No Significant Maternal Medications:  Meds include: Other: PNV Significant Maternal Lab Results: Lab values include: Group B Strep negative, Rh negative (received Rhogam 10/03/15)  TDAP: 09/27/15 Flu: 06/16/15  ROS:10 Systems reviewed and are negative for acute change except as noted in the HPI.   No Known Allergies   Blood  pressure 100/49, pulse 95, temperature 98.3 F (36.8 C), temperature source Oral, resp. rate 16, height  (1.702 m), weight 69.763 kg (153 lb 12.8 oz), last menstrual period 03/03/2015, unknown if currently breastfeeding.  Chest clear Heart RRR without murmur Abd gravid, NT, FH CWD Pelvic: 6/80/0 per RN @ 1654, +pooling EFW: pelvis proven to 9+6  Cephalic by VE Ext: WNL FHR: BL 130 w/ moderate variability, +accels, no decels UCs: q 1-2, palpate strong  Prenatal  labs: ABO, Rh: --/--/A NEG (03/17 1755) Antibody: POS (03/17 1755) --- received Rhogam 10/03/15 Rubella: Immune RPR: Nonreactive (03/17 0000)  HBsAg: Negative (03/17 0000)  HIV: Non-reactive (03/17 0000)  GBS: Negative (03/17 0000) Sickle cell/Hgb electrophoresis: NA Pap: Neg (2016) GC: Neg (01/31/15) Chlamydia: Neg (01/31/15) Genetic screenings: Declined Glucola: Normal at 112 Hgb 13.4 at NOB, 11.3 at 28 weeks   Assessment: IUP at 39.5 wks Active labor SROM FWB: Cat 1 GBS neg Rh neg H/O LEEP H/O ADD  Plan: Admit to [facility] Suite Routine [facility] orders Pain med/epidural prn - elects [facility]B at this time Consult as indicated Expect progress and SVD  Jane Foster, CNM 12/08/15, 09:49 PM

## 2015-12-09 ENCOUNTER — Encounter (HOSPITAL_COMMUNITY): Payer: Self-pay | Admitting: *Deleted

## 2015-12-09 LAB — CBC
HEMATOCRIT: 27.8 % — AB (ref 36.0–46.0)
HEMOGLOBIN: 9.3 g/dL — AB (ref 12.0–15.0)
MCH: 29.1 pg (ref 26.0–34.0)
MCHC: 33.5 g/dL (ref 30.0–36.0)
MCV: 86.9 fL (ref 78.0–100.0)
Platelets: 182 10*3/uL (ref 150–400)
RBC: 3.2 MIL/uL — AB (ref 3.87–5.11)
RDW: 13.6 % (ref 11.5–15.5)
WBC: 16.3 10*3/uL — ABNORMAL HIGH (ref 4.0–10.5)

## 2015-12-09 LAB — RPR: RPR Ser Ql: NONREACTIVE

## 2015-12-09 MED ORDER — FERROUS SULFATE 325 (65 FE) MG PO TABS
325.0000 mg | ORAL_TABLET | Freq: Two times a day (BID) | ORAL | Status: DC
Start: 2015-12-09 — End: 2015-12-10
  Administered 2015-12-09 – 2015-12-10 (×2): 325 mg via ORAL
  Filled 2015-12-09 (×2): qty 1

## 2015-12-09 NOTE — Progress Notes (Signed)
Jane Foster   Subjective: Post Partum Day 1 Vaginal delivery, 2nd degree laceration Patient up ad lib, denies syncope or dizziness. Reports consuming regular diet without issues and denies N/V No issues with urination and reports bleeding is appropriate  Feeding:  breast Contraceptive plan:   micronor  Objective: Temp:  [97.5 F (36.4 C)-98.4 F (36.9 C)] 97.5 F (36.4 C) (03/18 0413) Pulse Rate:  [55-118] 55 (03/18 0413) Resp:  [16-20] 16 (03/18 0413) BP: (92-145)/(27-122) 100/54 mmHg (03/18 0413) Weight:  [153 lb 12.8 oz (69.763 kg)] 153 lb 12.8 oz (69.763 kg) (03/17 1641)  Physical Exam:  General: alert and cooperative Ext: WNL, no significant  edema. No evidence of DVT seen on physical exam. Breast: Soft filling Lungs: CTAB Heart RRR without murmur  Abdomen:  Soft, fundus firm, lochia scant, + bowel sounds, non distended, non tender Lochia: appropriate Uterine Fundus: firm Laceration: healing well    Recent Labs  12/08/15 1755 12/09/15 0639  HGB 11.5* 9.3*  HCT 33.3* 27.8*    Assessment S/P Vaginal Delivery-Day 1 Stable  Normal Involution Breastfeeding  Plan: Continue current care Plan for discharge tomorrow Lactation support   Doniven Vanpatten, CNM, MSN 12/09/2015, 11:18 AM

## 2015-12-09 NOTE — Lactation Note (Signed)
This note was copied from a baby's chart. Lactation Consultation Note  Parents and baby resting. P4, Ex BF, most was 10 months. Mother states she knows how to hand express and has viewed drops. Denies problems or concerns with breastfeeding. Asked question about pumping in the future. Mom made aware of O/P services, breastfeeding support groups, community resources, and our phone # for post-discharge questions.     Patient Name: Jane Foster'UToday's Date: 12/09/2015     Maternal Data    Feeding Feeding Type: Breast Fed  LATCH Score/Interventions                      Lactation Tools Discussed/Used     Consult Status      Hardie PulleyBerkelhammer, Ruth Boschen 12/09/2015, 2:58 PM

## 2015-12-10 LAB — RH IG WORKUP (INCLUDES ABO/RH)
ABO/RH(D): A NEG
FETAL SCREEN: NEGATIVE
Gestational Age(Wks): 40
Unit division: 0

## 2015-12-10 MED ORDER — OXYCODONE-ACETAMINOPHEN 5-325 MG PO TABS: 1.0000 | ORAL_TABLET | ORAL | Status: AC | PRN

## 2015-12-10 MED ORDER — FERROUS SULFATE 325 (65 FE) MG PO TABS: 325.0000 mg | ORAL_TABLET | Freq: Two times a day (BID) | ORAL | Status: AC

## 2015-12-10 MED ORDER — IBUPROFEN 600 MG PO TABS: 600.0000 mg | ORAL_TABLET | Freq: Four times a day (QID) | ORAL | Status: AC

## 2015-12-10 NOTE — Lactation Note (Addendum)
This note was copied from a baby's chart. Lactation Consultation Note  P4, Baby 36 hours old.  7.8% weight loss.   Baby is tongue humping, diorganized suck.  Reviewed suck training with mother. Mother latched baby in cross cradle position.  Encouraged her to compress breast when she latches until swallows are observed. Sucks and some swallows observed. Mother denies other questions or problems. Mother's nipples are pink and tender.  Discussed applying ebm, coconut oil and provided her with comfort gels. Mom encouraged to feed baby 8-12 times/24 hours and with feeding cues.  Reviewed engorgement care and monitoring voids/stools.   Patient Name: Jane Foster GEXBM'WToday's Date: 12/10/2015 Reason for consult: Follow-up assessment   Maternal Data    Feeding Feeding Type: Breast Fed  LATCH Score/Interventions Latch: Grasps breast easily, tongue down, lips flanged, rhythmical sucking.  Audible Swallowing: A few with stimulation  Type of Nipple: Everted at rest and after stimulation  Comfort (Breast/Nipple): Filling, red/small blisters or bruises, mild/mod discomfort     Hold (Positioning): No assistance needed to correctly position infant at breast.  LATCH Score: 8  Lactation Tools Discussed/Used     Consult Status Consult Status: Complete    Hardie PulleyBerkelhammer, Ruth Boschen 12/10/2015, 8:59 AM

## 2015-12-10 NOTE — Discharge Summary (Signed)
OB Discharge Summary  Patient Name: Jane Foster DOB: [birth year] MRN: [id number]  Date of admission: 12/08/2015 Delivering MD: Sherre ScarletWILLIAMS, KIMBERLY   Date of discharge: 12/10/2015  Admitting diagnosis: 40w water broke Intrauterine pregnancy: 9581w0d     Secondary diagnosis:Principal Problem:   Vaginal delivery Active Problems:   Second-degree perineal laceration, with delivery  Additional problems: . ADD (attention deficit disorder) 04/27/2015  . H/O LEEP--2006 04/27/2015  . H/O molar pregnancy, antepartum 10/08/2012            Discharge diagnosis: Term Pregnancy Delivered and Anemia                                                                     Post partum procedures:none  Augmentation: none  Complications: None  Hospital course:  Onset of Labor With Vaginal Delivery     30 y.o. yo [id number] at 6081w0d was admitted in Active Labor on 12/08/2015. Patient had an uncomplicated labor course as follows:  Membrane Rupture Time/Date: 2:30 PM ,12/08/2015   Intrapartum Procedures: Episiotomy: None [1]                                         Lacerations:  2nd degree [3];Vaginal [6]  Patient had a delivery of a Viable infant. 12/08/2015  Information for the patient's newborn:  Kieth BrightlyMorton, Girl Shahira [[id number]][[id number]]  Delivery Method: Vag-Spont    Pateint had an uncomplicated postpartum course.  She is ambulating, tolerating a regular diet, passing flatus, and urinating well. Patient is discharged home in stable condition on 12/10/2015.    Physical exam  Filed Vitals:   12/09/15 0000 12/09/15 0413 12/09/15 1739 12/10/15 0553  BP: 100/49 100/54 110/57 108/56  Pulse: 95 55 70 59  Temp: 98.3 F (36.8 C) 97.5 F (36.4 C) 98.3 F (36.8 C) 97.7 F (36.5 C)  TempSrc: Oral Oral Oral Oral  Resp: 16 16 16 16   Height:      Weight:      SpO2:   98%    General: alert, cooperative and no distress Lochia: appropriate Uterine Fundus: firm Incision: Healing well with no  significant drainage DVT Evaluation: No evidence of DVT seen on physical exam. Labs: Lab Results  Component Value Date   WBC 16.3* 12/09/2015   HGB 9.3* 12/09/2015   HCT 27.8* 12/09/2015   MCV 86.9 12/09/2015   PLT 182 12/09/2015   CMP Latest Ref Rng 10/27/2014  Glucose 70 - 99 mg/dL 80  BUN 6 - 23 mg/dL 10  Creatinine 6.640.50 - 4.031.10 mg/dL 4.740.80  Sodium 259135 - 563145 mmol/L 139  Potassium 3.5 - 5.1 mmol/L 4.0  Chloride 96 - 112 mmol/L 105  CO2 19 - 32 mmol/L 25  Calcium 8.4 - 10.5 mg/dL 9.3    Discharge instruction: per After Visit Summary and "Baby and Me Booklet".  Medications:  Current facility-administered medications:  .  acetaminophen (TYLENOL) tablet 650 mg, 650 mg, Oral, Q4H PRN, Sherre ScarletKimberly Williams, CNM .  benzocaine-Menthol (DERMOPLAST) 20-0.5 % topical spray 1 application, 1 application, Topical, PRN, Sherre ScarletKimberly Williams, CNM .  witch hazel-glycerin (TUCKS) pad 1 application, 1 application, Topical, PRN **AND** dibucaine (NUPERCAINAL)  1 % rectal ointment 1 application, 1 application, Rectal, PRN, Sherre Scarlet, CNM .  diphenhydrAMINE (BENADRYL) capsule 25 mg, 25 mg, Oral, Q6H PRN, Sherre Scarlet, CNM .  ferrous sulfate tablet 325 mg, 325 mg, Oral, BID WC, Hermenegildo Clausen, CNM, 325 mg at 12/09/15 1719 .  ibuprofen (ADVIL,MOTRIN) tablet 600 mg, 600 mg, Oral, 4 times per day, Sherre Scarlet, CNM, 600 mg at 12/10/15 0552 .  lanolin ointment, , Topical, PRN, Sherre Scarlet, CNM .  lidocaine (PF) (XYLOCAINE) 1 % injection 30 mL, 30 mL, Subcutaneous, PRN, Hoover Browns, MD, 30 mL at 12/08/15 2055 .  ondansetron (ZOFRAN) tablet 4 mg, 4 mg, Oral, Q4H PRN **OR** ondansetron (ZOFRAN) injection 4 mg, 4 mg, Intravenous, Q4H PRN, Sherre Scarlet, CNM .  oxyCODONE-acetaminophen (PERCOCET/ROXICET) 5-325 MG per tablet 1 tablet, 1 tablet, Oral, Q4H PRN, Sherre Scarlet, CNM, 1 tablet at 12/10/15 0553 .  oxyCODONE-acetaminophen (PERCOCET/ROXICET) 5-325 MG per tablet 2 tablet, 2 tablet,  Oral, Q4H PRN, Sherre Scarlet, CNM .  prenatal multivitamin tablet 1 tablet, 1 tablet, Oral, Q1200, Sherre Scarlet, CNM, 1 tablet at 12/09/15 1145 .  senna-docusate (Senokot-S) tablet 2 tablet, 2 tablet, Oral, Q24H, Sherre Scarlet, CNM, 2 tablet at 12/10/15 0023 .  simethicone (MYLICON) chewable tablet 80 mg, 80 mg, Oral, PRN, Sherre Scarlet, CNM .  zolpidem (AMBIEN) tablet 5 mg, 5 mg, Oral, QHS PRN, Sherre Scarlet, CNM After Visit Meds:    Medication List    ASK your doctor about these medications        acetaminophen 500 MG tablet  Commonly known as:  TYLENOL  Take 1,000 mg by mouth every 6 (six) hours as needed for mild pain or headache.     prenatal multivitamin Tabs tablet  Take 1 tablet by mouth daily at 12 noon.        Diet: routine diet  Activity: Advance as tolerated. Pelvic rest for 6 weeks.   Outpatient follow up:6 weeks Follow up Appt:No future appointments. Follow up visit: No Follow-up on file.  Postpartum contraception: Progesterone only pills  Newborn Data: Live born female  Birth Weight: 8 lb 0.9 oz (3655 g) APGAR: 8, 9  Baby Feeding: Breast Disposition:home with mother   12/10/2015 Nyrah Demos, CNM      Postpartum Care After Vaginal Delivery  After you deliver your newborn (postpartum period), the usual stay in the hospital is 24 72 hours. If there were problems with your labor or delivery, or if you have other medical problems, you might be in the hospital longer.  While you are in the hospital, you will receive help and instructions on how to care for yourself and your newborn during the postpartum period.  While you are in the hospital:  Be sure to tell your nurses if you have pain or discomfort, as well as where you feel the pain and what makes the pain worse.  If you had an incision made near your vagina (episiotomy) or if you had some tearing during delivery, the nurses may put ice packs on your episiotomy or tear. The  ice packs may help to reduce the pain and swelling.  If you are breastfeeding, you may feel uncomfortable contractions of your uterus for a couple of weeks. This is normal. The contractions help your uterus get back to normal size.  It is normal to have some bleeding after delivery.  For the first 1 3 days after delivery, the flow is red and the amount may be similar to a period.  It  is common for the flow to start and stop.  In the first few days, you may pass some small clots. Let your nurses know if you begin to pass large clots or your flow increases.  Do not  flush blood clots down the toilet before having the nurse look at them.  During the next 3 10 days after delivery, your flow should become more watery and pink or brown-tinged in color.  Ten to fourteen days after delivery, your flow should be a small amount of yellowish-white discharge.  The amount of your flow will decrease over the first few weeks after delivery. Your flow may stop in 6 8 weeks. Most women have had their flow stop by 12 weeks after delivery.  You should change your sanitary pads frequently.  Wash your hands thoroughly with soap and water for at least 20 seconds after changing pads, using the toilet, or before holding or feeding your newborn.  You should feel like you need to empty your bladder within the first 6 8 hours after delivery.  In case you become weak, lightheaded, or faint, call your nurse before you get out of bed for the first time and before you take a shower for the first time.  Within the first few days after delivery, your breasts may begin to feel tender and full. This is called engorgement. Breast tenderness usually goes away within 48 72 hours after engorgement occurs. You may also notice milk leaking from your breasts. If you are not breastfeeding, do not stimulate your breasts. Breast stimulation can make your breasts produce more milk.  Spending as much time as possible with your  newborn is very important. During this time, you and your newborn can feel close and get to know each other. Having your newborn stay in your room (rooming in) will help to strengthen the bond with your newborn. It will give you time to get to know your newborn and become comfortable caring for your newborn.  Your hormones change after delivery. Sometimes the hormone changes can temporarily cause you to feel sad or tearful. These feelings should not last more than a few days. If these feelings last longer than that, you should talk to your caregiver.  If desired, talk to your caregiver about methods of family planning or contraception.  Talk to your caregiver about immunizations. Your caregiver may want you to have the following immunizations before leaving the hospital:  Tetanus, diphtheria, and pertussis (Tdap) or tetanus and diphtheria (Td) immunization. It is very important that you and your family (including grandparents) or others caring for your newborn are up-to-date with the Tdap or Td immunizations. The Tdap or Td immunization can help protect your newborn from getting ill.  Rubella immunization.  Varicella (chickenpox) immunization.  Influenza immunization. You should receive this annual immunization if you did not receive the immunization during your pregnancy. Document Released: 07/07/2007 Document Revised: 06/03/2012 Document Reviewed: 05/06/2012 [facility] Patient Information 2014 [place], [place].   Postpartum Depression and Baby Blues  The postpartum period begins right after the birth of a baby. During this time, there is often a great amount of joy and excitement. It is also a time of considerable changes in the life of the parent(s). Regardless of how many times a mother gives birth, each child brings new challenges and dynamics to the family. It is not unusual to have feelings of excitement accompanied by confusing shifts in moods, emotions, and thoughts. All mothers are at  risk of developing postpartum depression  or the "baby blues." These mood changes can occur right after giving birth, or they may occur many months after giving birth. The baby blues or postpartum depression can be mild or severe. Additionally, postpartum depression can resolve rather quickly, or it can be a long-term condition. CAUSES Elevated hormones and their rapid decline are thought to be a main cause of postpartum depression and the baby blues. There are a number of hormones that radically change during and after pregnancy. Estrogen and progesterone usually decrease immediately after delivering your baby. The level of thyroid hormone and various cortisol steroids also rapidly drop. Other factors that play a major role in these changes include major life events and genetics.  RISK FACTORS If you have any of the following risks for the baby blues or postpartum depression, know what symptoms to watch out for during the postpartum period. Risk factors that may increase the likelihood of getting the baby blues or postpartum depression include: 1. Havinga personal or family history of depression. 2. Having depression while being pregnant. 3. Having premenstrual or oral contraceptive-associated mood issues. 4. Having exceptional life stress. 5. Having marital conflict. 6. Lacking a social support network. 7. Having a baby with special needs. 8. Having health problems such as diabetes. SYMPTOMS Baby blues symptoms include:  Brief fluctuations in mood, such as going from extreme happiness to sadness.  Decreased concentration.  Difficulty sleeping.  Crying spells, tearfulness.  Irritability.  Anxiety. Postpartum depression symptoms typically begin within the first month after giving birth. These symptoms include:  Difficulty sleeping or excessive sleepiness.  Marked weight loss.  Agitation.  Feelings of worthlessness.  Lack of interest in activity or food. Postpartum psychosis is a  very concerning condition and can be dangerous. Fortunately, it is rare. Displaying any of the following symptoms is cause for immediate medical attention. Postpartum psychosis symptoms include:  Hallucinations and delusions.  Bizarre or disorganized behavior.  Confusion or disorientation. DIAGNOSIS  A diagnosis is made by an evaluation of your symptoms. There are no medical or lab tests that lead to a diagnosis, but there are various questionnaires that a caregiver may use to identify those with the baby blues, postpartum depression, or psychosis. Often times, a screening tool called the [place] Postnatal Depression Scale is used to diagnose depression in the postpartum period.  TREATMENT The baby blues usually goes away on its own in 1 to 2 weeks. Social support is often all that is needed. You should be encouraged to get adequate sleep and rest. Occasionally, you may be given medicines to help you sleep.  Postpartum depression requires treatment as it can last several months or longer if it is not treated. Treatment may include individual or group therapy, medicine, or both to address any social, physiological, and psychological factors that may play a role in the depression. Regular exercise, a healthy diet, rest, and social support may also be strongly recommended.  Postpartum psychosis is more serious and needs treatment right away. Hospitalization is often needed. HOME CARE INSTRUCTIONS  Get as much rest as you can. Nap when the baby sleeps.  Exercise regularly. Some women find yoga and walking to be beneficial.  Eat a balanced and nourishing diet.  Do little things that you enjoy. Have a cup of tea, take a bubble bath, read your favorite magazine, or listen to your favorite music.  Avoid alcohol.  Ask for help with household chores, cooking, grocery shopping, or running errands as needed. Do not try to do everything.  Talk  to people close to you about how you are feeling. Get  support from your partner, family members, friends, or other new moms.  Try to stay positive in how you think. Think about the things you are grateful for.  Do not spend a lot of time alone.  Only take medicine as directed by your caregiver.  Keep all your postpartum appointments.  Let your caregiver know if you have any concerns. SEEK MEDICAL CARE IF: You are having a reaction or problems with your medicine. SEEK IMMEDIATE MEDICAL CARE IF:  You have suicidal feelings.  You feel you may harm the baby or someone else. Document Released: 06/13/2004 Document Revised: 12/02/2011 Document Reviewed: 07/16/2011 [facility] Patient Information 2014 [place], [place].     Breastfeeding Deciding to breastfeed is one of the best choices you can make for you and your baby. A change in hormones during pregnancy causes your breast tissue to grow and increases the number and size of your milk ducts. These hormones also allow proteins, sugars, and fats from your blood supply to make breast milk in your milk-producing glands. Hormones prevent breast milk from being released before your baby is born as well as prompt milk flow after birth. Once breastfeeding has begun, thoughts of your baby, as well as his or her sucking or crying, can stimulate the release of milk from your milk-producing glands.  BENEFITS OF BREASTFEEDING For Your Baby  Your first milk (colostrum) helps your baby's digestive system function better.   There are antibodies in your milk that help your baby fight off infections.   Your baby has a lower incidence of asthma, allergies, and sudden infant death syndrome.   The nutrients in breast milk are better for your baby than infant formulas and are designed uniquely for your baby's needs.   Breast milk improves your baby's brain development.   Your baby is less likely to develop other conditions, such as childhood obesity, asthma, or type 2 diabetes mellitus.  For You    Breastfeeding helps to create a very special bond between you and your baby.   Breastfeeding is convenient. Breast milk is always available at the correct temperature and costs nothing.   Breastfeeding helps to burn calories and helps you lose the weight gained during pregnancy.   Breastfeeding makes your uterus contract to its prepregnancy size faster and slows bleeding (lochia) after you give birth.   Breastfeeding helps to lower your risk of developing type 2 diabetes mellitus, osteoporosis, and breast or ovarian cancer later in life. SIGNS THAT YOUR BABY IS HUNGRY Early Signs of Hunger  Increased alertness or activity.  Stretching.  Movement of the head from side to side.  Movement of the head and opening of the mouth when the corner of the mouth or cheek is stroked (rooting).  Increased sucking sounds, smacking lips, cooing, sighing, or squeaking.  Hand-to-mouth movements.  Increased sucking of fingers or hands. Late Signs of Hunger  Fussing.  Intermittent crying. Extreme Signs of Hunger Signs of extreme hunger will require calming and consoling before your baby will be able to breastfeed successfully. Do not wait for the following signs of extreme hunger to occur before you initiate breastfeeding:   Restlessness.  A loud, strong cry.   Screaming.   BREASTFEEDING BASICS Breastfeeding Initiation  Find a comfortable place to sit or lie down, with your neck and back well supported.  Place a pillow or rolled up blanket under your baby to bring him or her to the level  of your breast (if you are seated). Nursing pillows are specially designed to help support your arms and your baby while you breastfeed.  Make sure that your baby's abdomen is facing your abdomen.   Gently massage your breast. With your fingertips, massage from your chest wall toward your nipple in a circular motion. This encourages milk flow. You may need to continue this action during the  feeding if your milk flows slowly.  Support your breast with 4 fingers underneath and your thumb above your nipple. Make sure your fingers are well away from your nipple and your baby's mouth.   Stroke your baby's lips gently with your finger or nipple.   When your baby's mouth is open wide enough, quickly bring your baby to your breast, placing your entire nipple and as much of the colored area around your nipple (areola) as possible into your baby's mouth.   More areola should be visible above your baby's upper lip than below the lower lip.   Your baby's tongue should be between his or her lower gum and your breast.   Ensure that your baby's mouth is correctly positioned around your nipple (latched). Your baby's lips should create a seal on your breast and be turned out (everted).  It is common for your baby to suck about 2-3 minutes in order to start the flow of breast milk. Latching Teaching your baby how to latch on to your breast properly is very important. An improper latch can cause nipple pain and decreased milk supply for you and poor weight gain in your baby. Also, if your baby is not latched onto your nipple properly, he or she may swallow some air during feeding. This can make your baby fussy. Burping your baby when you switch breasts during the feeding can help to get rid of the air. However, teaching your baby to latch on properly is still the best way to prevent fussiness from swallowing air while breastfeeding. Signs that your baby has successfully latched on to your nipple:    Silent tugging or silent sucking, without causing you pain.   Swallowing heard between every 3-4 sucks.    Muscle movement above and in front of his or her ears while sucking.  Signs that your baby has not successfully latched on to nipple:   Sucking sounds or smacking sounds from your baby while breastfeeding.  Nipple pain. If you think your baby has not latched on correctly, slip your  finger into the corner of your baby's mouth to break the suction and place it between your baby's gums. Attempt breastfeeding initiation again. Signs of Successful Breastfeeding Signs from your baby:   A gradual decrease in the number of sucks or complete cessation of sucking.   Falling asleep.   Relaxation of his or her body.   Retention of a small amount of milk in his or her mouth.   Letting go of your breast by himself or herself. Signs from you:  Breasts that have increased in firmness, weight, and size 1-3 hours after feeding.   Breasts that are softer immediately after breastfeeding.  Increased milk volume, as well as a change in milk consistency and color by the fifth day of breastfeeding.   Nipples that are not sore, cracked, or bleeding. Signs That Your Pecola Leisure is Getting Enough Milk  Wetting at least 3 diapers in a 24-hour period. The urine should be clear and pale yellow by age 59 days.  At least 3 stools in a 24-hour  period by age 20 days. The stool should be soft and yellow.  At least 3 stools in a 24-hour period by age 60 days. The stool should be seedy and yellow.  No loss of weight greater than 10% of birth weight during the first 21 days of age.  Average weight gain of 4-7 ounces (113-198 g) per week after age 81 days.  Consistent daily weight gain by age 20 days, without weight loss after the age of 2 weeks. After a feeding, your baby may spit up a small amount. This is common. BREASTFEEDING FREQUENCY AND DURATION Frequent feeding will help you make more milk and can prevent sore nipples and breast engorgement. Breastfeed when you feel the need to reduce the fullness of your breasts or when your baby shows signs of hunger. This is called "breastfeeding on demand." Avoid introducing a pacifier to your baby while you are working to establish breastfeeding (the first 4-6 weeks after your baby is born). After this time you may choose to use a pacifier. Research has  shown that pacifier use during the first year of a baby's life decreases the risk of sudden infant death syndrome (SIDS). Allow your baby to feed on each breast as long as he or she wants. Breastfeed until your baby is finished feeding. When your baby unlatches or falls asleep while feeding from the first breast, offer the second breast. Because newborns are often sleepy in the first few weeks of life, you may need to awaken your baby to get him or her to feed. Breastfeeding times will vary from baby to baby. However, the following rules can serve as a guide to help you ensure that your baby is properly fed:  Newborns (babies 56 weeks of age or younger) may breastfeed every 1-3 hours.  Newborns should not go longer than 3 hours during the day or 5 hours during the night without breastfeeding.  You should breastfeed your baby a minimum of 8 times in a 24-hour period until you begin to introduce solid foods to your baby at around 19 months of age. BREAST MILK PUMPING Pumping and storing breast milk allows you to ensure that your baby is exclusively fed your breast milk, even at times when you are unable to breastfeed. This is especially important if you are going back to work while you are still breastfeeding or when you are not able to be present during feedings. Your lactation consultant can give you guidelines on how long it is safe to store breast milk.  A breast pump is a machine that allows you to pump milk from your breast into a sterile bottle. The pumped breast milk can then be stored in a refrigerator or freezer. Some breast pumps are operated by hand, while others use electricity. Ask your lactation consultant which type will work best for you. Breast pumps can be purchased, but some hospitals and breastfeeding support groups lease breast pumps on a monthly basis. A lactation consultant can teach you how to hand express breast milk, if you prefer not to use a pump.  CARING FOR YOUR BREASTS WHILE  YOU BREASTFEED Nipples can become dry, cracked, and sore while breastfeeding. The following recommendations can help keep your breasts moisturized and healthy:  Avoid using soap on your nipples.   Wear a supportive bra. Although not required, special nursing bras and tank tops are designed to allow access to your breasts for breastfeeding without taking off your entire bra or top. Avoid wearing underwire-style bras or  extremely tight bras.  Air dry your nipples for 3-70minutes after each feeding.   Use only cotton bra pads to absorb leaked breast milk. Leaking of breast milk between feedings is normal.   Use lanolin on your nipples after breastfeeding. Lanolin helps to maintain your skin's normal moisture barrier. If you use pure lanolin, you do not need to wash it off before feeding your baby again. Pure lanolin is not toxic to your baby. You may also hand express a few drops of breast milk and gently massage that milk into your nipples and allow the milk to air dry. In the first few weeks after giving birth, some women experience extremely full breasts (engorgement). Engorgement can make your breasts feel heavy, warm, and tender to the touch. Engorgement peaks within 3-5 days after you give birth. The following recommendations can help ease engorgement:  Completely empty your breasts while breastfeeding or pumping. You may want to start by applying warm, moist heat (in the shower or with warm water-soaked hand towels) just before feeding or pumping. This increases circulation and helps the milk flow. If your baby does not completely empty your breasts while breastfeeding, pump any extra milk after he or she is finished.  Wear a snug bra (nursing or regular) or tank top for 1-2 days to signal your body to slightly decrease milk production.  Apply ice packs to your breasts, unless this is too uncomfortable for you.  Make sure that your baby is latched on and positioned properly while  breastfeeding. If engorgement persists after 48 hours of following these recommendations, contact your health care provider or a Advertising copywriter. OVERALL HEALTH CARE RECOMMENDATIONS WHILE BREASTFEEDING  Eat healthy foods. Alternate between meals and snacks, eating 3 of each per day. Because what you eat affects your breast milk, some of the foods may make your baby more irritable than usual. Avoid eating these foods if you are sure that they are negatively affecting your baby.  Drink milk, fruit juice, and water to satisfy your thirst (about 10 glasses a day).   Rest often, relax, and continue to take your prenatal vitamins to prevent fatigue, stress, and anemia.  Continue breast self-awareness checks.  Avoid chewing and smoking tobacco.  Avoid alcohol and drug use. Some medicines that may be harmful to your baby can pass through breast milk. It is important to ask your health care provider before taking any medicine, including all over-the-counter and prescription medicine as well as vitamin and herbal supplements. It is possible to become pregnant while breastfeeding. If birth control is desired, ask your health care provider about options that will be safe for your baby. SEEK MEDICAL CARE IF:   You feel like you want to stop breastfeeding or have become frustrated with breastfeeding.  You have painful breasts or nipples.  Your nipples are cracked or bleeding.  Your breasts are red, tender, or warm.  You have a swollen area on either breast.  You have a fever or chills.  You have nausea or vomiting.  You have drainage other than breast milk from your nipples.  Your breasts do not become full before feedings by the fifth day after you give birth.  You feel sad and depressed.  Your baby is too sleepy to eat well.  Your baby is having trouble sleeping.   Your baby is wetting less than 3 diapers in a 24-hour period.  Your baby has less than 3 stools in a 24-hour  period.  Your baby's skin or  the white part of his or her eyes becomes yellow.   Your baby is not gaining weight by 53 days of age. SEEK IMMEDIATE MEDICAL CARE IF:   Your baby is overly tired (lethargic) and does not want to wake up and feed.  Your baby develops an unexplained fever. Document Released: 09/09/2005 Document Revised: 09/14/2013 Document Reviewed: 03/03/2013 [facility] Patient Information 2015 [place], [place]. This information is not intended to replace advice given to you by your health care provider. Make sure you discuss any questions you have with your health care provider.

## 2015-12-12 LAB — TYPE AND SCREEN
ABO/RH(D): A NEG
Antibody Screen: POSITIVE
DAT, IGG: NEGATIVE
UNIT DIVISION: 0
Unit division: 0

## 2016-03-19 ENCOUNTER — Ambulatory Visit (HOSPITAL_COMMUNITY)
Admission: EM | Admit: 2016-03-19 | Discharge: 2016-03-19 | Disposition: A | Discharge status: Home / Self Care | Payer: Medicaid Other | Attending: Family Medicine | Admitting: Family Medicine

## 2016-03-19 ENCOUNTER — Encounter (HOSPITAL_COMMUNITY): Payer: Self-pay | Admitting: Emergency Medicine

## 2016-03-19 DIAGNOSIS — H9202 Otalgia, left ear: Secondary | ICD-10-CM

## 2016-03-19 DIAGNOSIS — T700XXA Otitic barotrauma, initial encounter: Secondary | ICD-10-CM | POA: Diagnosis not present

## 2016-03-19 DIAGNOSIS — H6982 Other specified disorders of Eustachian tube, left ear: Secondary | ICD-10-CM | POA: Diagnosis not present

## 2016-03-19 DIAGNOSIS — R0982 Postnasal drip: Secondary | ICD-10-CM | POA: Diagnosis not present

## 2016-03-19 NOTE — ED Notes (Signed)
[facility] with a complaint of left ear pain x 5 days.

## 2016-03-19 NOTE — Discharge Instructions (Signed)
Barotitis Media Sudafed PE 5 to 10 mg every 6 hours as needed for congestion Females so use an antihistamine such as Allegra, Claritin or Zyrtec. Barotitis media is inflammation of your middle ear. This occurs when the auditory tube (eustachian tube) leading from the back of your nose (nasopharynx) to your eardrum is blocked. This blockage may result from a cold, environmental allergies, or an upper respiratory infection. Unresolved barotitis media may lead to damage or hearing loss (barotrauma), which may become permanent. HOME CARE INSTRUCTIONS   Use medicines as recommended by your health care provider. Over-the-counter medicines will help unblock the canal and can help during times of air travel.  Do not put anything into your ears to clean or unplug them. Eardrops will not be helpful.  Do not swim, dive, or fly until your health care provider says it is all right to do so. If these activities are necessary, chewing gum with frequent, forceful swallowing may help. It is also helpful to hold your nose and gently blow to pop your ears for equalizing pressure changes. This forces air into the eustachian tube.  Only take over-the-counter or prescription medicines for pain, discomfort, or fever as directed by your health care provider.  A decongestant may be helpful in decongesting the middle ear and make pressure equalization easier. SEEK MEDICAL CARE IF:  You experience a serious form of dizziness in which you feel as if the room is spinning and you feel nauseated (vertigo).  Your symptoms only involve one ear. SEEK IMMEDIATE MEDICAL CARE IF:   You develop a severe headache, dizziness, or severe ear pain.  You have bloody or pus-like drainage from your ears.  You develop a fever.  Your problems do not improve or become worse. MAKE SURE YOU:   Understand these instructions.  Will watch your condition.  Will get help right away if you are not doing well or get worse.   This  information is not intended to replace advice given to you by your health care provider. Make sure you discuss any questions you have with your health care provider.   Document Released: 09/06/2000 Document Revised: 06/30/2013 Document Reviewed: 04/06/2013 Elsevier Interactive Patient Education [employer]2016 Elsevier Inc.

## 2016-03-19 NOTE — ED Provider Notes (Signed)
CSN: [id number]     Arrival date & time 03/19/16  1641 History   First MD Initiated Contact with Patient 03/19/16 1818     Chief Complaint  Patient presents with  . Otalgia   (Consider location/radiation/quality/duration/timing/severity/associated sxs/prior Treatment) HPI Comments: 30 year old female complaining of left earache for 5 days. Associated symptoms include minor occasional dizziness and nausea. Denies fever or chills, sore throat. She is breast-feeding.   Past Medical History  Diagnosis Date  . Bleeding nose   . Abnormal Pap smear 2006, 2012, 2013    Colpo done, rpt paps, Last pap 06/2012 was normal  . Infection     UTI;frequently in the past, none in 3-4 years  . Blood type, Rh negative    Past Surgical History  Procedure Laterality Date  . Dilation and curettage of uterus  2011    d/t molar pregnancy  . Cosmetic surgery      birth mark removal in the 4th grade   Family History  Problem Relation Age of Onset  . Other Maternal Grandmother     Varicose veins  . Breast cancer Mother     Menopausal  . Breast cancer Maternal Grandmother     Menopausal  . Bipolar disorder Brother    Social History  Substance Use Topics  . Smoking status: Former Smoker    Quit date: 07/24/2012  . Smokeless tobacco: Never Used  . Alcohol Use: No     Comment: Prior to pregnancy 1 beer/night   OB History    Gravida Para Term Preterm AB TAB SAB Ectopic Multiple Living   6 4 4  2  2   0 4     Review of Systems  Constitutional: Negative for fever, activity change and fatigue.  HENT: Positive for ear pain and postnasal drip. Negative for congestion, ear discharge, hearing loss and sore throat.   Respiratory: Negative.  Negative for cough and shortness of breath.   Neurological: Positive for dizziness. Negative for speech difficulty and headaches.  All other systems reviewed and are negative.   Allergies  Review of patient's allergies indicates no known allergies.  Home  Medications   Prior to Admission medications   Medication Sig Start Date End Date Taking? Authorizing Provider  ibuprofen (ADVIL,MOTRIN) 600 MG tablet Take 1 tablet (600 mg total) by mouth every 6 (six) hours. 12/10/15  Yes Venus Standard, CNM  oxyCODONE-acetaminophen (PERCOCET/ROXICET) 5-325 MG tablet Take 1 tablet by mouth every 4 (four) hours as needed (pain scale 4-7). 12/10/15  Yes Venus Standard, CNM  ferrous sulfate 325 (65 FE) MG tablet Take 1 tablet (325 mg total) by mouth 2 (two) times daily with a meal. 12/10/15   Venus Standard, CNM  Prenatal Vit-Fe Fumarate-FA (PRENATAL MULTIVITAMIN) TABS tablet Take 1 tablet by mouth daily at 12 noon.    Historical Provider, MD   Meds Ordered and Administered this Visit  Medications - No data to display  BP 102/63 mmHg  Pulse 60  Temp(Src) 98 F (36.7 C) (Oral)  Resp 14  SpO2 100%  LMP 02/27/2016 (Exact Date)  Breastfeeding? Yes No data found.   Physical Exam  Constitutional: She is oriented to person, place, and time. She appears well-developed and well-nourished. No distress.  HENT:  Head: Normocephalic and atraumatic.  Bilateral TMs are retracted, left greater than the right. No erythema. No apparent fluid/effusion. The EACs are clear. No foreign bodies or excessive cerumen.  Oropharynx with minor streaky erythema. No exudates.  Eyes: Conjunctivae and EOM are normal.  Neck: Normal range of motion. Neck supple.  Cardiovascular: Normal rate.   Pulmonary/Chest: Effort normal.  Neurological: She is alert and oriented to person, place, and time.  Skin: Skin is warm and dry.  Nursing note and vitals reviewed.   ED Course  Procedures (including critical care time)  Labs Review Labs Reviewed - No data to display  Imaging Review No results found.   Visual Acuity Review  Right Eye Distance:   Left Eye Distance:   Bilateral Distance:    Right Eye Near:   Left Eye Near:    Bilateral Near:         MDM   1. ETD  (eustachian tube dysfunction), left   2. Barotitis media, initial encounter   3. Otalgia of left ear   4. PND (post-nasal drip)    Sudafed PE 5 to 10 mg every 6 hours as needed for congestion  use an antihistamine such as Allegra, Claritin or Zyrtec. Reassurance. Explained reason for diagnoses.    Hayden Rasmussenavid Loreta Blouch, NP 03/19/16 1842

## 2016-04-30 IMAGING — DX DG CHEST 2V
2 series · 2 of 2 positions shown · non-contrast
Comparison: May 01, 2008

CLINICAL DATA: Sharp in character chest pain for 2 days

EXAM:
CHEST  2 VIEW

[chest pa]
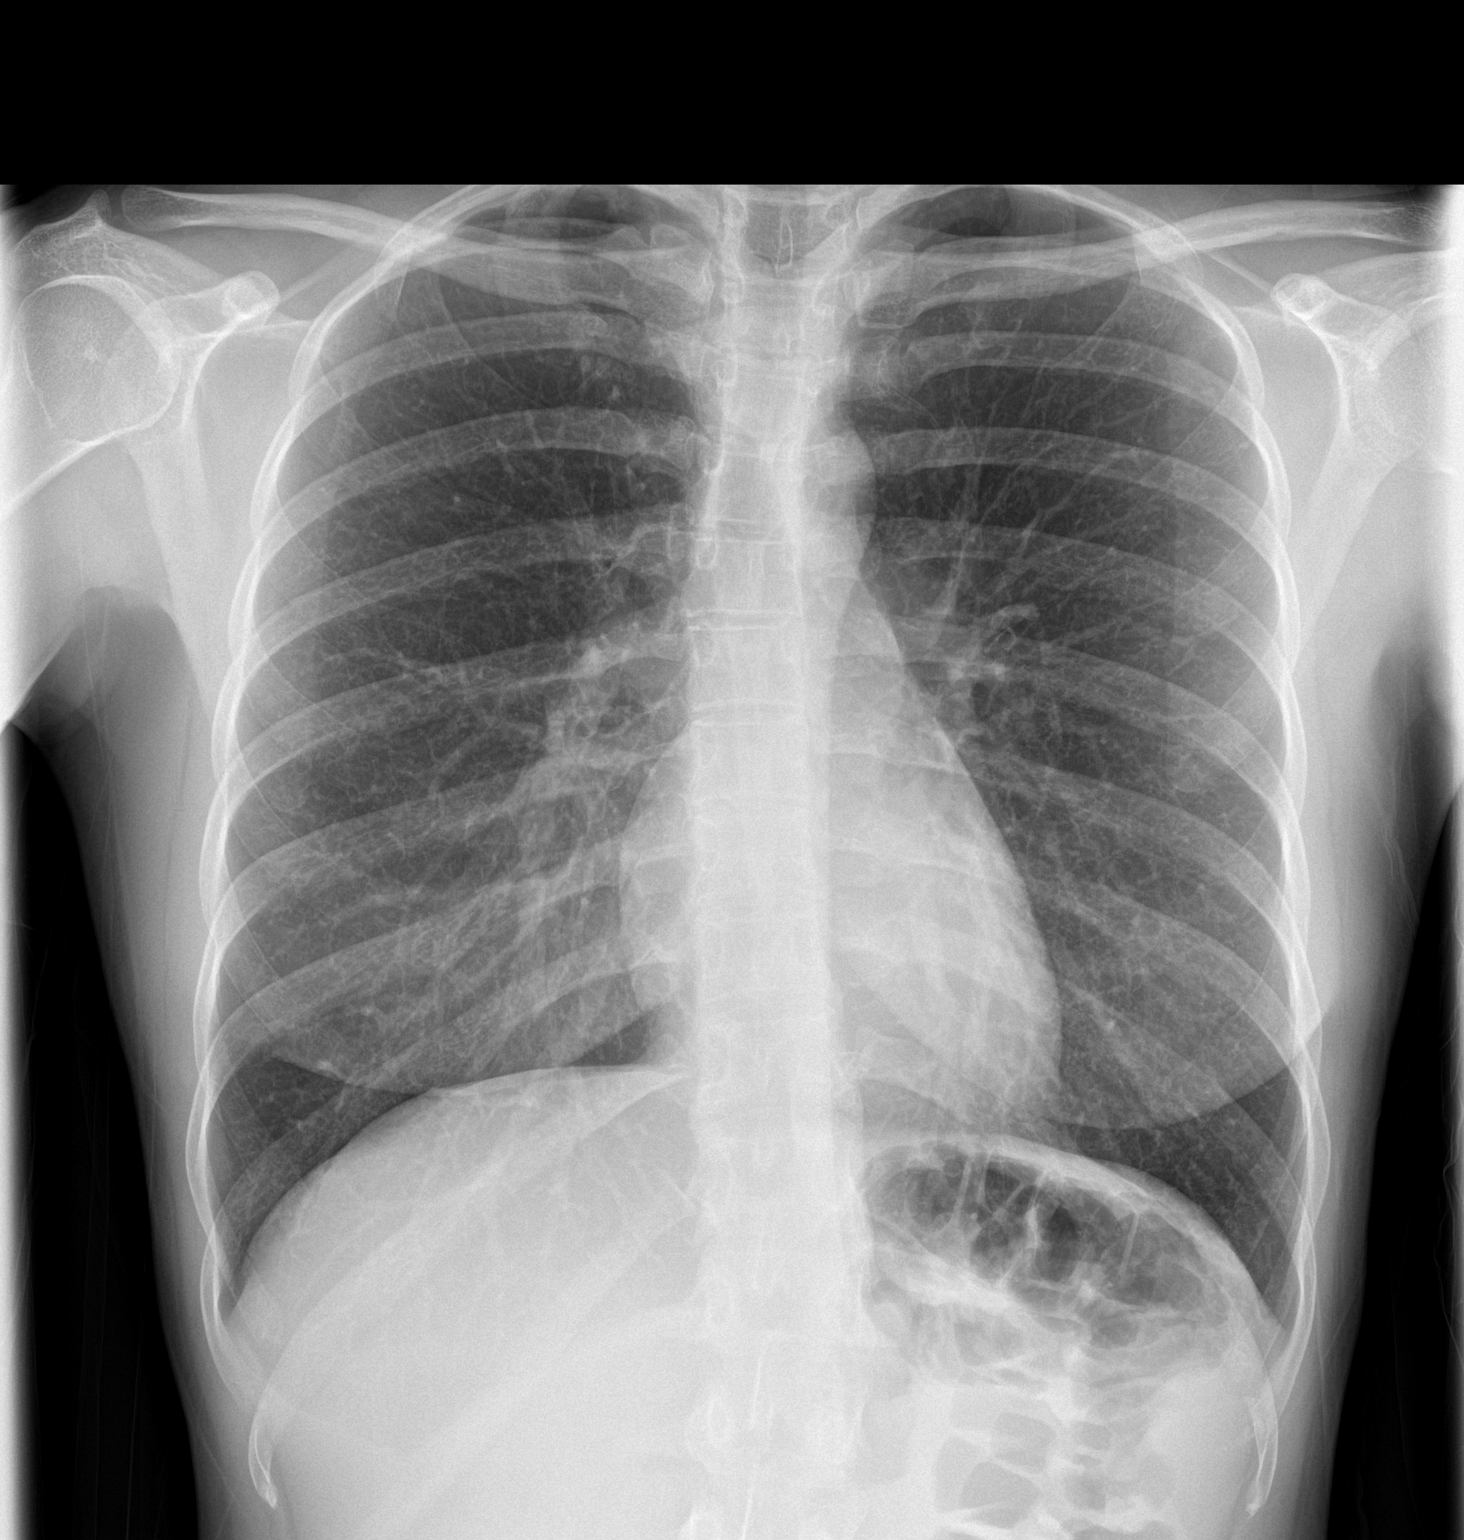

[chest lat]
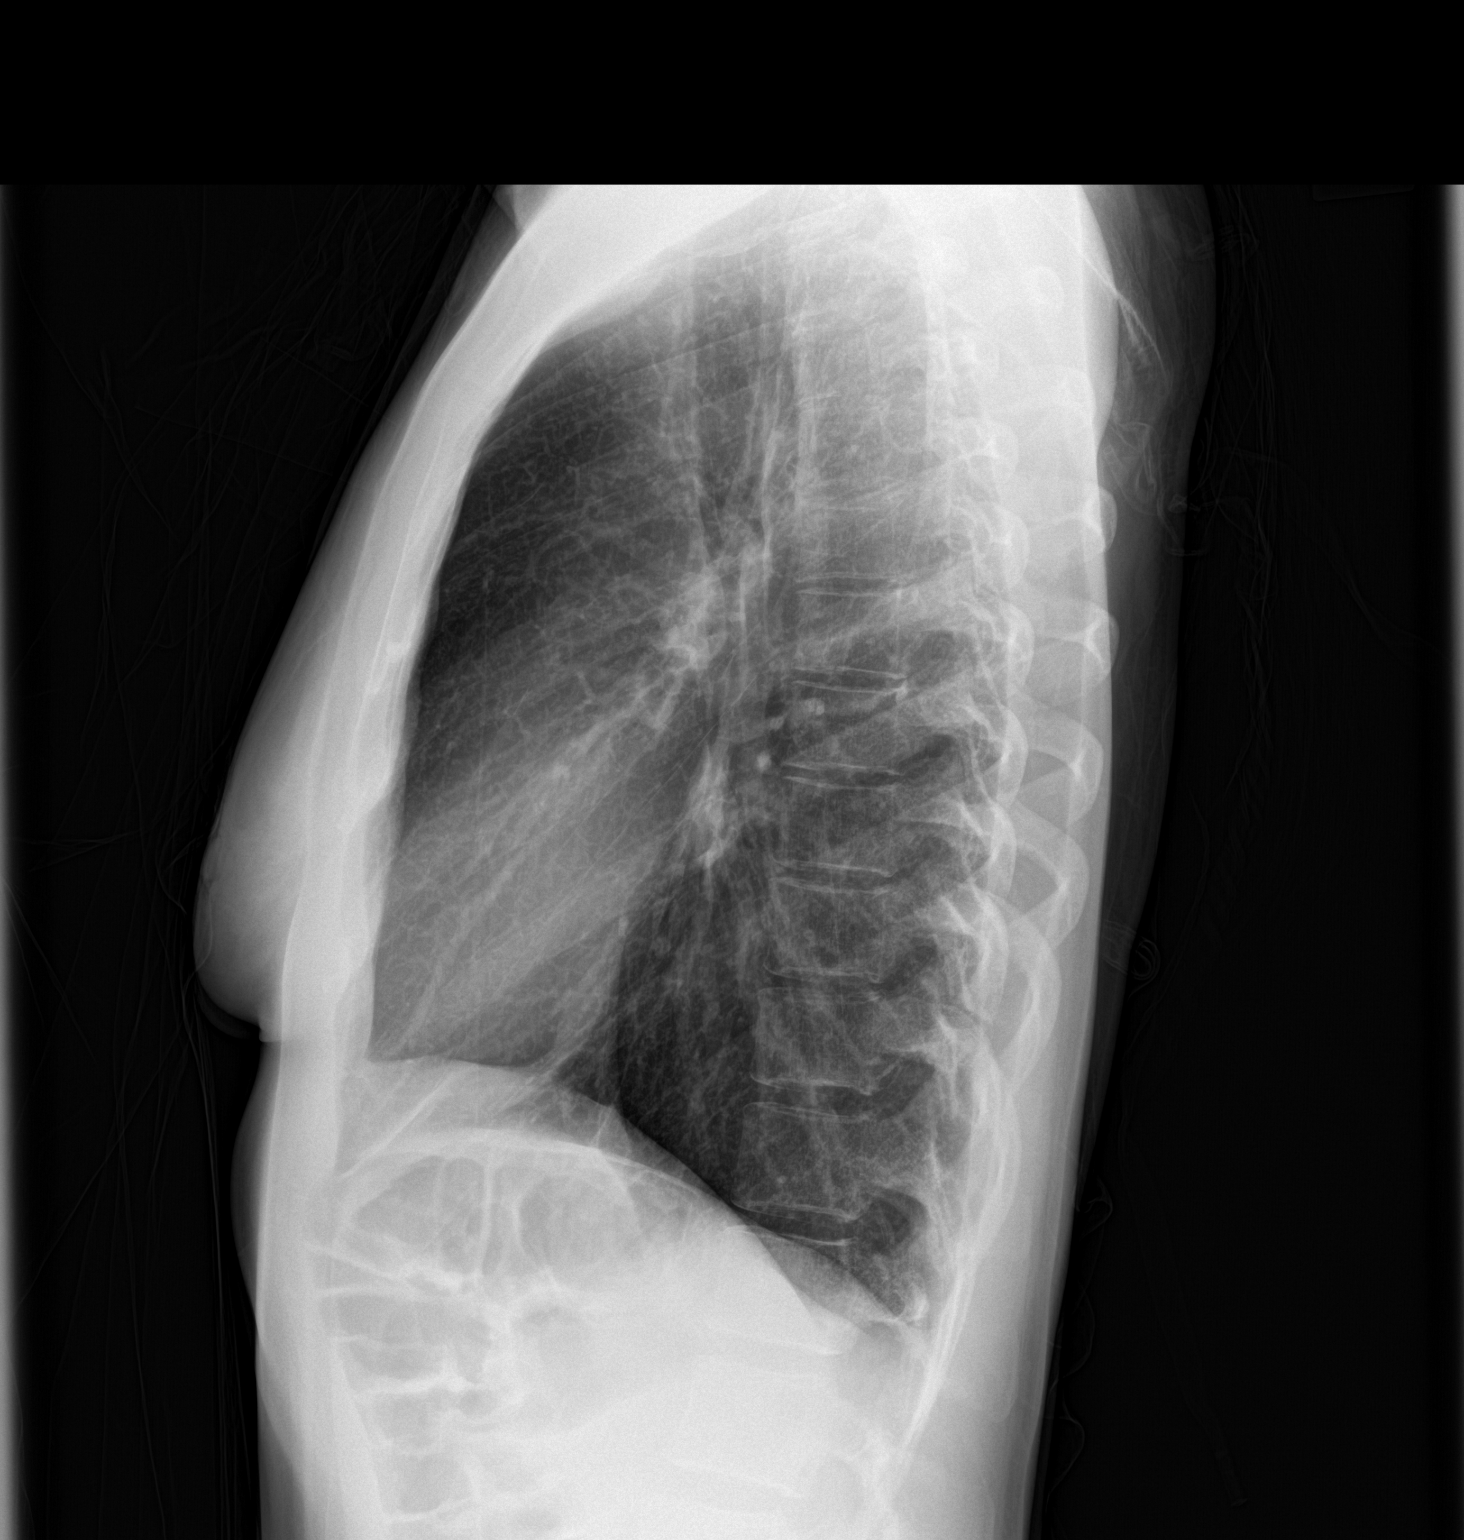

[2 of 2 positions shown; findings below may reference images not displayed]

FINDINGS: There is no edema or consolidation. Heart size and pulmonary
vascularity are normal. No adenopathy. No pneumothorax. No bone
lesions. There is slight upper thoracic levoscoliosis.
IMPRESSION: No edema or consolidation.

## 2016-10-07 ENCOUNTER — Ambulatory Visit (INDEPENDENT_AMBULATORY_CARE_PROVIDER_SITE_OTHER): Payer: BLUE CROSS/BLUE SHIELD | Admitting: Nurse Practitioner

## 2016-10-07 ENCOUNTER — Encounter: Payer: Self-pay | Admitting: Nurse Practitioner

## 2016-10-07 VITALS — BP 90/50 | HR 69 | Temp 98.2°F | Ht 67.0 in | Wt 124.0 lb

## 2016-10-07 DIAGNOSIS — Z0000 Encounter for general adult medical examination without abnormal findings: Secondary | ICD-10-CM

## 2016-10-07 DIAGNOSIS — R21 Rash and other nonspecific skin eruption: Secondary | ICD-10-CM

## 2016-10-07 MED ORDER — CLOTRIMAZOLE-BETAMETHASONE 1-0.05 % EX CREA: 1.0000 "application " | TOPICAL_CREAM | Freq: Two times a day (BID) | CUTANEOUS | 0 refills | Status: AC

## 2016-10-07 NOTE — Progress Notes (Signed)
Pre visit review using our clinic review tool, if applicable. No additional management support is needed unless otherwise documented below in the visit note. 

## 2016-10-07 NOTE — Progress Notes (Signed)
Subjective:    Patient ID: Jane Foster, female    DOB: [birth year], 31 y.o.   MRN: [id number]  Patient presents today for complete physical or establish care (new patient) and rash  Rash  This is a new problem. The current episode started 1 to 4 weeks ago. The problem is unchanged. Location: left breast. The rash is characterized by itchiness. She was exposed to nothing. Pertinent negatives include no anorexia, congestion, cough, diarrhea, facial edema, fatigue, fever, joint pain, nail changes, shortness of breath or sore throat. Treatments tried: nystatin x 2days. The treatment provided no relief.    current breast feeding out of right breast only. Has 10months old daughter  Immunizations: (TDAP, Hep C screen, Pneumovax, Influenza, zoster)  Health Maintenance  Topic Date Due  . Pap Smear  07/08/2007  . Flu Shot  09/23/2017*  . Tetanus Vaccine  04/07/2023  . HIV Screening  Completed  *Topic was postponed. The date shown is not the original due date.   Diet: Weight:  Wt Readings from Last 3 Encounters:  10/07/16 124 lb (56.2 kg)  12/08/15 153 lb 12.8 oz (69.8 kg)  04/27/15 130 lb 3.2 oz (59.1 kg)   Exercise:3x/day Fall Risk: Fall Risk  10/07/2016  Falls in the past year? No   Home Safety:home with husband and children Depression/Suicide: Depression screen [facility] 2[facility] 10/07/2016  Decreased Interest 0  Down, Depressed, Hopeless 0  PHQ - 2 Score 0   No flowsheet data found. Pap Smear (every 5545yrs for >21-29 without HPV, every 595yrs for >30-1770yrs with HPV):up to date per patient, last done 12/2015 by [facility], normal per patient Vision:needed Dental:up to date Advanced Directive: Advanced Directives 12/08/2015  Does Patient Have a Medical Advance Directive? No  Would patient like information on creating a medical advance directive? No - patient declined information  Pre-existing out of facility DNR order (yellow form or pink MOST form) -   Sexual History (birth  control, marital status, STD): married, sexually active, 4children, no birth control.  Medications and allergies reviewed with patient and updated if appropriate.  Patient Active Problem List   Diagnosis Date Noted  . Vaginal delivery 12/08/2015  . Second-degree perineal laceration, with delivery 12/08/2015  . Blood type, Rh negative 04/27/2015  . ADD (attention deficit disorder) 04/27/2015  . H/O LEEP--2006 04/27/2015  . H/O molar pregnancy, antepartum 10/08/2012    Current Outpatient Prescriptions on File Prior to Visit  Medication Sig Dispense Refill  . ferrous sulfate 325 (65 FE) MG tablet Take 1 tablet (325 mg total) by mouth 2 (two) times daily with a meal. 60 tablet 3  . ibuprofen (ADVIL,MOTRIN) 600 MG tablet Take 1 tablet (600 mg total) by mouth every 6 (six) hours. 30 tablet 0  . oxyCODONE-acetaminophen (PERCOCET/ROXICET) 5-325 MG tablet Take 1 tablet by mouth every 4 (four) hours as needed (pain scale 4-7). 30 tablet 0  . Prenatal Vit-Fe Fumarate-FA (PRENATAL MULTIVITAMIN) TABS tablet Take 1 tablet by mouth daily at 12 noon.     No current facility-administered medications on file prior to visit.     Past Medical History:  Diagnosis Date  . Abnormal Pap smear 2006, 2012, 2013   Colpo done, rpt paps, Last pap 06/2012 was normal  . Allergy   . Bleeding nose   . Blood type, Rh negative   . Depression 2003  . Frequent headaches   . Heart murmur    as a child  . History of chicken pox   .  Infection    UTI;frequently in the past, none in 3-4 years    Past Surgical History:  Procedure Laterality Date  . COSMETIC SURGERY     birth mark removal in the 4th grade  . DILATION AND CURETTAGE OF UTERUS  2011   d/t molar pregnancy  . removal  1996   birthpark remove    Social History   Social History  . Marital status: Married    Spouse name: Cristy Colmenares  . Number of children: 2  . Years of education: 15   Occupational History  . [employer]    Social History Main  Topics  . Smoking status: Former Smoker    Quit date: 07/24/2012  . Smokeless tobacco: Never Used  . Alcohol use Yes     Comment: 2 a week  . Drug use: No  . Sexual activity: Yes    Partners: Male    Birth control/ protection: Pill, None     Comment: Calendar/rhythm method   Other Topics Concern  . None   Social History Narrative  . None    Family History  Problem Relation Age of Onset  . Other Maternal Grandmother     Varicose veins  . Breast cancer Maternal Grandmother     Menopausal  . Arthritis Maternal Grandmother   . Cancer Maternal Grandmother     lung cancer, tobacco use  . Breast cancer Mother 31    Menopausal  . Cancer Mother     breast cancer  . Bipolar disorder Brother   . Mental illness Father   . Heart disease Maternal Grandfather   . Alcohol abuse Paternal Grandmother   . Alcohol abuse Paternal Grandfather         Review of Systems  Constitutional: Negative for fatigue, fever, malaise/fatigue and weight loss.  HENT: Negative for congestion and sore throat.   Eyes:       Negative for visual changes  Respiratory: Negative for cough and shortness of breath.   Cardiovascular: Negative for chest pain, palpitations and leg swelling.  Gastrointestinal: Negative for anorexia, blood in stool, constipation, diarrhea and heartburn.  Genitourinary: Negative for dysuria, frequency and urgency.  Musculoskeletal: Negative for falls, joint pain and myalgias.  Skin: Positive for rash. Negative for nail changes.  Neurological: Negative for dizziness, sensory change and headaches.  Endo/Heme/Allergies: Does not bruise/bleed easily.  Psychiatric/Behavioral: Negative for depression, substance abuse and suicidal ideas. The patient is not nervous/anxious.     Objective:   Vitals:   10/07/16 1514  BP: (!) 90/50  Pulse: 69  Temp: 98.2 F (36.8 C)    Body mass index is 19.42 kg/m.   Physical Examination:  Physical Exam  Constitutional: She is oriented to  person, place, and time and well-developed, well-nourished, and in no distress. No distress.  HENT:  Right Ear: External ear normal.  Left Ear: External ear normal.  Nose: Nose normal.  Mouth/Throat: Oropharynx is clear and moist. No oropharyngeal exudate.  Eyes: Conjunctivae and EOM are normal. Pupils are equal, round, and reactive to light. No scleral icterus.  Neck: Normal range of motion. Neck supple. No thyromegaly present.  Cardiovascular: Normal rate, normal heart sounds and intact distal pulses.   Pulmonary/Chest: Effort normal and breath sounds normal. She exhibits no tenderness. Right breast exhibits no inverted nipple, no mass, no nipple discharge, no skin change and no tenderness. Left breast exhibits no inverted nipple, no mass, no nipple discharge, no skin change and no tenderness. Breasts are symmetrical.  Musculoskeletal: Normal range of motion. She exhibits no edema or tenderness.  Lymphadenopathy:    She has no cervical adenopathy.  Neurological: She is alert and oriented to person, place, and time. Gait normal.  Skin: Skin is warm and dry. Rash noted. Rash is maculopapular. There is erythema.     Psychiatric: Affect and judgment normal.    ASSESSMENT and PLAN:  Tamaka was seen today for establish care.  Diagnoses and all orders for this visit:  Encounter for medical examination to establish care  Acute maculopapular rash -     clotrimazole-betamethasone (LOTRISONE) cream; Apply 1 application topically 2 (two) times daily.   No problem-specific Assessment & Plan notes found for this encounter.     Follow up: Return in about 3 months (around 12/23/2016) for CPE (fasting).  Alysia Penna, NP

## 2016-10-07 NOTE — Patient Instructions (Addendum)
Please send medical release [facility]  Call office if no improvement of rash in 2weeks.  We will consider breast US if no improvement on breast pain after weaning child from breastfeeding.  Make appt for birth control initiation once complete with breastfeeding.

## 2016-10-29 IMAGING — US US OB COMP LESS 14 WK
1 series · 14 of 28 positions shown · non-contrast
Comparison: None for this pregnancy

CLINICAL DATA: Vaginal bleeding, positive pregnancy test

EXAM:
OBSTETRIC <14 WK US AND TRANSVAGINAL OB US
TECHNIQUE: Both transabdominal and transvaginal ultrasound examinations were
performed for complete evaluation of the gestation as well as the
maternal uterus, adnexal regions, and pelvic cul-de-sac.
Transvaginal technique was performed to assess early pregnancy.

[Series 1: us ob transvaginal · 14 of 64 slices shown]
[im 3/64]
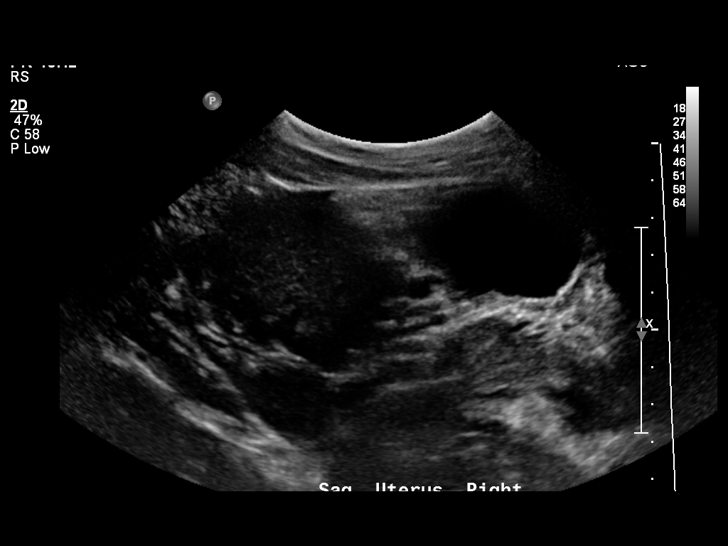
[im 8/64]
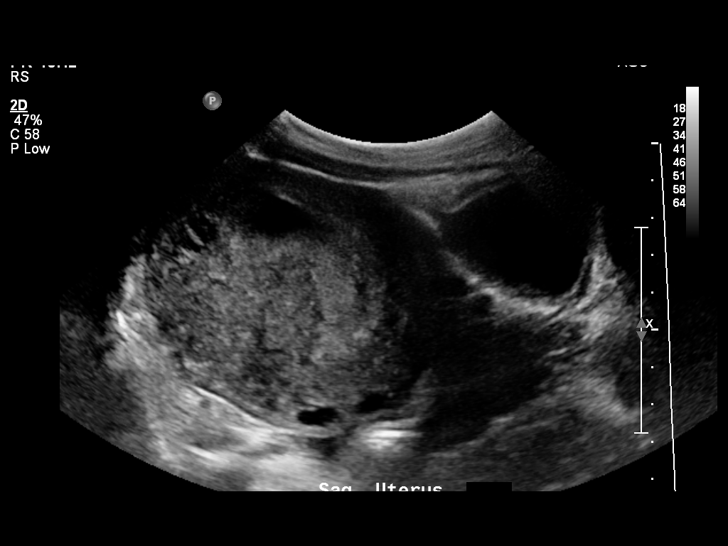
[im 12/64]
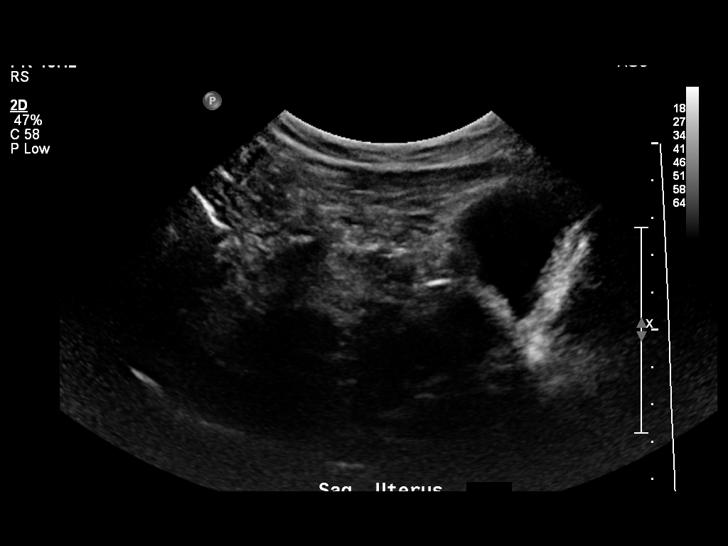
[im 17/64]
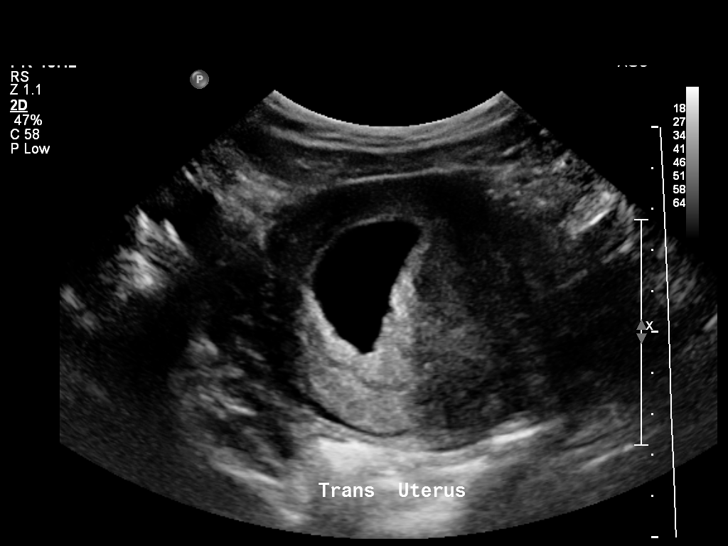
[im 22/64]
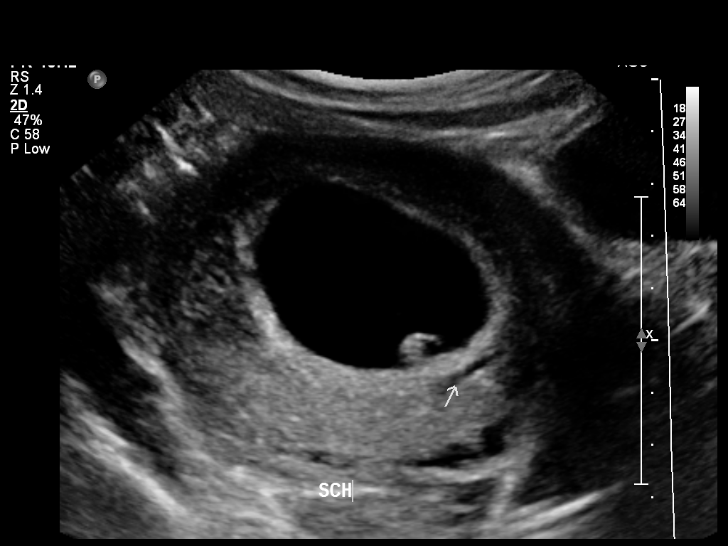
[im 26/64]
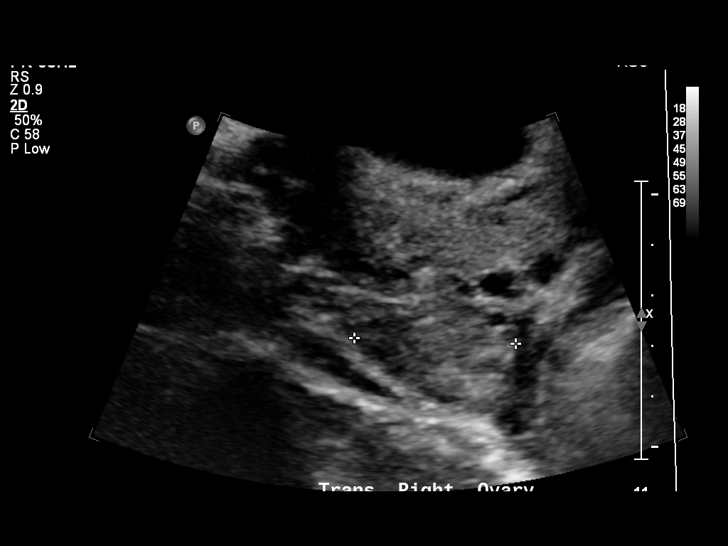
[im 31/64]
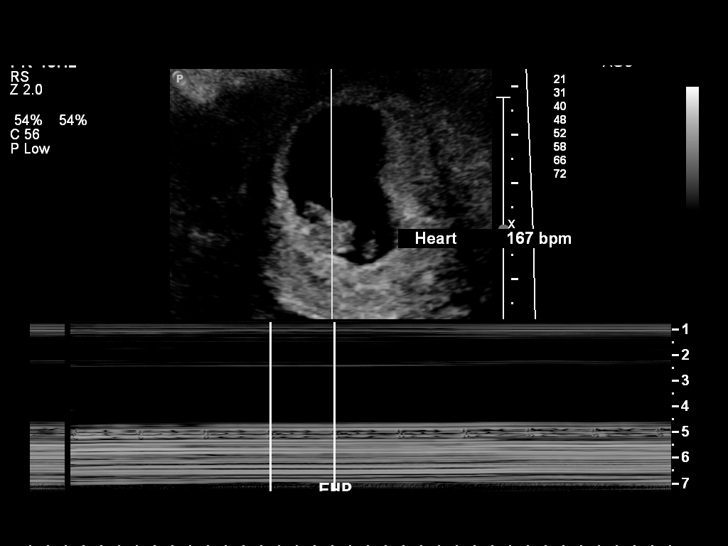
[im 36/64]
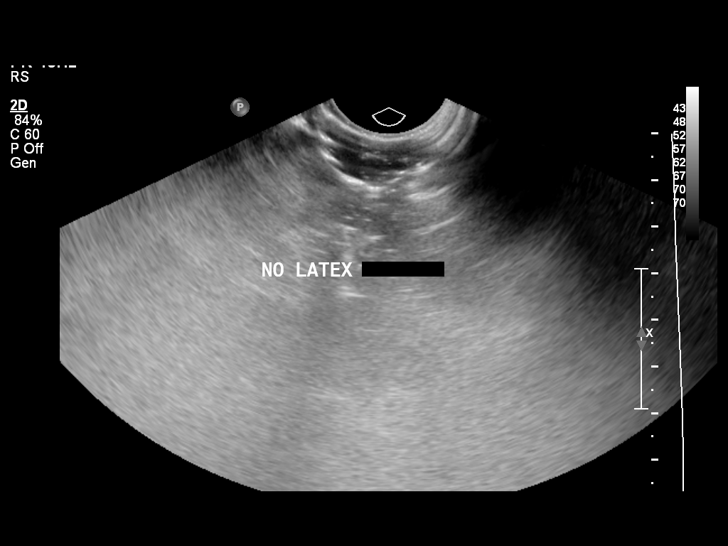
[im 40/64]
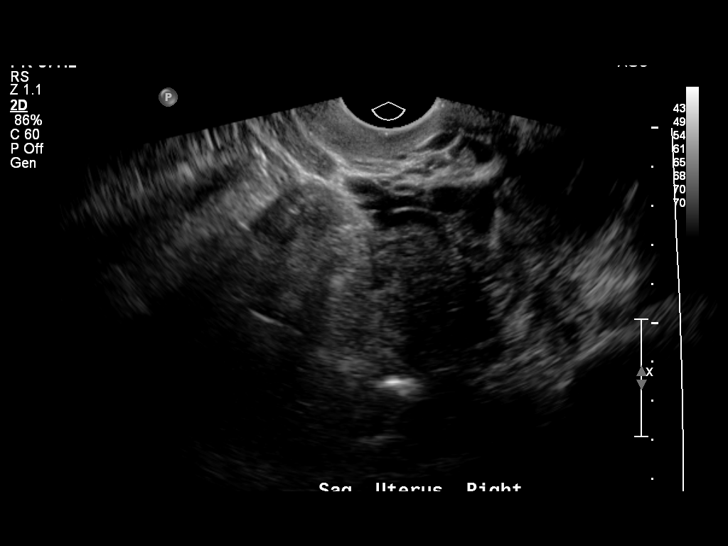
[im 45/64]
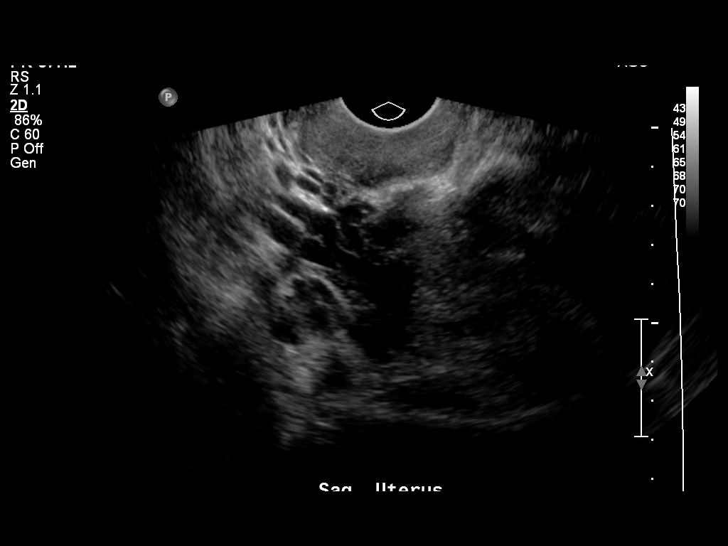
[im 50/64]
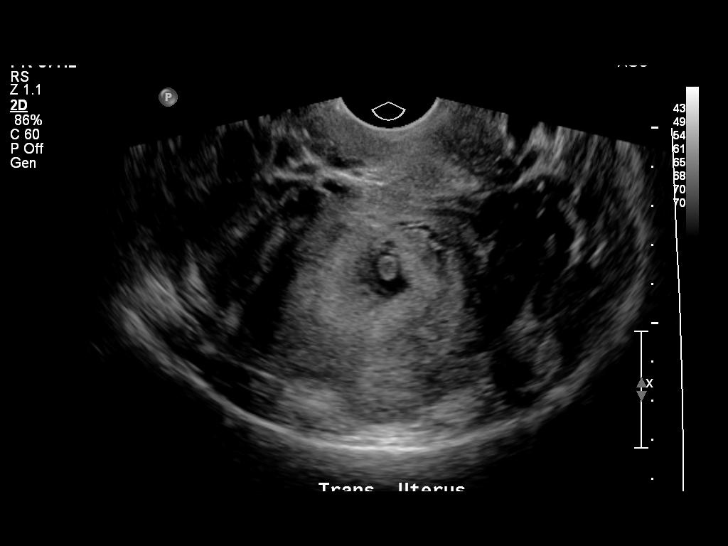
[im 54/64]
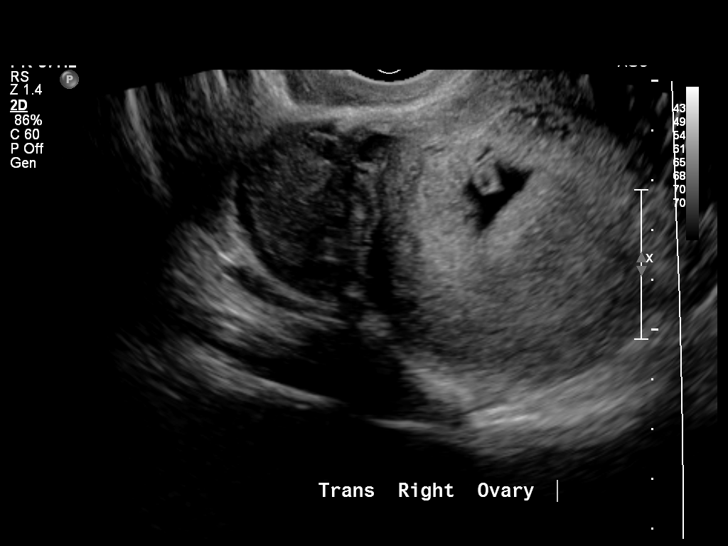
[im 59/64]
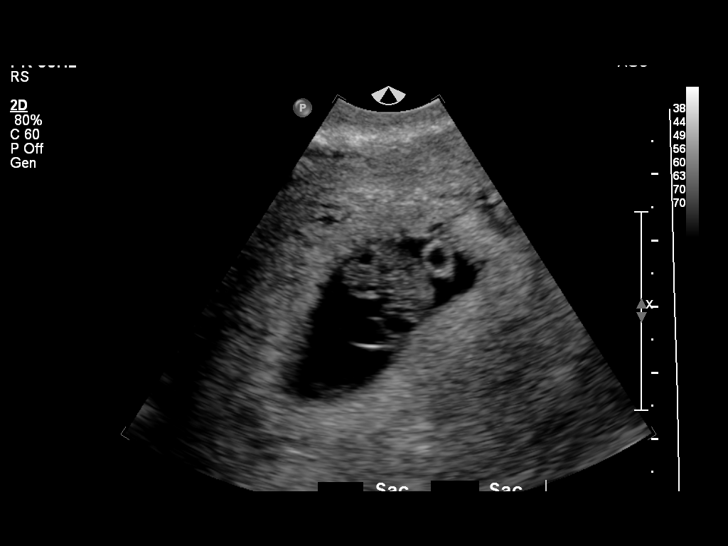
[im 64/64]
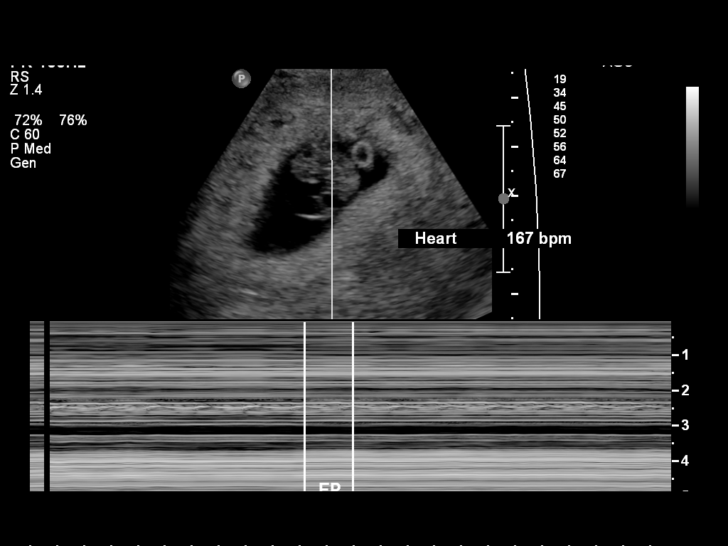

[14 of 28 positions shown; findings below may reference images not displayed]

FINDINGS: Intrauterine gestational sac: Visualized/normal in shape.

Yolk sac:  Visualized

Embryo:  Visualized

Cardiac Activity: Visualized

Heart Rate: 167  bpm

CRL:  15  mm   7 w   6 d                  US EDC: 12/08/15

Maternal uterus/adnexae: Right ovary is normal in appearance. Left
ovary not visualized. No adnexal mass. No free fluid. Trace
subchorionic hemorrhage noted.
IMPRESSION: Intrauterine gestational sac, yolk sac, fetal pole, and cardiac
activity, with gestational age 7 weeks 6 days concordant with
assigned gestational age by LMP. Trace subchorionic hemorrhage.

## 2017-01-07 ENCOUNTER — Encounter: Payer: BLUE CROSS/BLUE SHIELD | Admitting: Nurse Practitioner

## 2017-07-17 ENCOUNTER — Encounter: Payer: Self-pay | Admitting: Nurse Practitioner

## 2017-07-17 ENCOUNTER — Ambulatory Visit (INDEPENDENT_AMBULATORY_CARE_PROVIDER_SITE_OTHER): Payer: BLUE CROSS/BLUE SHIELD | Admitting: Nurse Practitioner

## 2017-07-17 VITALS — BP 92/60 | HR 75 | Temp 97.9°F | Ht 67.0 in | Wt 121.0 lb

## 2017-07-17 DIAGNOSIS — R41840 Attention and concentration deficit: Secondary | ICD-10-CM

## 2017-07-17 NOTE — Patient Instructions (Signed)
You will be contacted to schedule appointment for ADHD screen.

## 2017-07-17 NOTE — Progress Notes (Signed)
   Subjective:  Patient ID: Jane Foster, female    DOB: November 04, 1985  Age: 31 y.o. MRN: [id number]  CC: Medication Refill (ADHD med consult? was on adderall but want to seek different med. )   HPI  Hx of Attention Deficit: Reports difflicult with attention and focusing on task. States she was diagnosed with learning disability in high school. She will like to resume school 09/2017. Used adderall in past but did not like side effects. She will like to know if another medication can be prescribed. Has not used any medication for over 8927yrs.  Outpatient Medications Prior to Visit  Medication Sig Dispense Refill  . clotrimazole-betamethasone (LOTRISONE) cream Apply 1 application topically 2 (two) times daily. 15 g 0  . ferrous sulfate 325 (65 FE) MG tablet Take 1 tablet (325 mg total) by mouth 2 (two) times daily with a meal. 60 tablet 3  . ibuprofen (ADVIL,MOTRIN) 600 MG tablet Take 1 tablet (600 mg total) by mouth every 6 (six) hours. 30 tablet 0  . oxyCODONE-acetaminophen (PERCOCET/ROXICET) 5-325 MG tablet Take 1 tablet by mouth every 4 (four) hours as needed (pain scale 4-7). 30 tablet 0  . Prenatal Vit-Fe Fumarate-FA (PRENATAL MULTIVITAMIN) TABS tablet Take 1 tablet by mouth daily at 12 noon.     No facility-administered medications prior to visit.    Review of Systems  Constitutional: Negative for malaise/fatigue.  Psychiatric/Behavioral: Negative for depression, memory loss and suicidal ideas. The patient is not nervous/anxious and does not have insomnia.     Objective:  BP 92/60   Pulse 75   Temp 97.9 F (36.6 C)   Ht 5\' 7"  (1.702 m)   Wt 121 lb (54.9 kg)   SpO2 98%   BMI 18.95 kg/m   BP Readings from Last 3 Encounters:  07/17/17 92/60  10/07/16 (!) 90/50  03/19/16 102/63    Wt Readings from Last 3 Encounters:  07/17/17 121 lb (54.9 kg)  10/07/16 124 lb (56.2 kg)  12/08/15 153 lb 12.8 oz (69.8 kg)    Physical Exam  Constitutional: She is oriented to person,  place, and time.  Cardiovascular: Normal rate.   Pulmonary/Chest: Effort normal.  Neurological: She is alert and oriented to person, place, and time.  Psychiatric: She has a normal mood and affect. Her behavior is normal.  Vitals reviewed.   Lab Results  Component Value Date   WBC 16.3 (H) 12/09/2015   HGB 9.3 (L) 12/09/2015   HCT 27.8 (L) 12/09/2015   PLT 182 12/09/2015   GLUCOSE 80 10/27/2014   NA 139 10/27/2014   K 4.0 10/27/2014   CL 105 10/27/2014   CREATININE 0.80 10/27/2014   BUN 10 10/27/2014   CO2 25 10/27/2014    No results found.  Assessment & Plan:   Jane Foster was seen today for medication refill.  Diagnoses and all orders for this visit:  Attention and concentration deficit -     Ambulatory referral to Neuropsychology   I am having Ms. Jane Foster maintain her prenatal multivitamin, ferrous sulfate, ibuprofen, oxyCODONE-acetaminophen, and clotrimazole-betamethasone.  No orders of the defined types were placed in this encounter.   Follow-up: Return in about 2 months (around 09/24/2017) for CPE (fasting).  Jane Pennaharlotte Izzie Geers, NP

## 2017-09-08 ENCOUNTER — Encounter: Payer: Self-pay | Admitting: Nurse Practitioner
